# Patient Record
Sex: Male | Born: 1945 | Race: White | Hispanic: No | Marital: Married | Smoking: Current every day smoker
Health system: Southern US, Community
[De-identification: ages and names within clinical notes are randomized; demographics above are authoritative.]

## PROBLEM LIST (undated history)

## (undated) DIAGNOSIS — I10 Essential (primary) hypertension: Secondary | ICD-10-CM

## (undated) DIAGNOSIS — K219 Gastro-esophageal reflux disease without esophagitis: Secondary | ICD-10-CM

## (undated) DIAGNOSIS — M1990 Unspecified osteoarthritis, unspecified site: Secondary | ICD-10-CM

---

## 1968-11-04 HISTORY — PX: OTHER SURGICAL HISTORY: SHX169

## 2010-03-04 DEATH — deceased

## 2011-07-22 ENCOUNTER — Emergency Department (HOSPITAL_COMMUNITY)
Admission: EM | Admit: 2011-07-22 | Discharge: 2011-07-23 | Disposition: A | Discharge status: Home / Self Care | Payer: BC Managed Care – PPO | Attending: Emergency Medicine | Admitting: Emergency Medicine

## 2011-07-22 DIAGNOSIS — W298XXA Contact with other powered powered hand tools and household machinery, initial encounter: Secondary | ICD-10-CM | POA: Insufficient documentation

## 2011-07-22 DIAGNOSIS — S68019A Complete traumatic metacarpophalangeal amputation of unspecified thumb, initial encounter: Secondary | ICD-10-CM | POA: Insufficient documentation

## 2011-07-29 NOTE — Op Note (Signed)
  NAME:  Travis Le, [name] NO.:  [id number]  MEDICAL RECORD NO.:  [id number]  LOCATION:                                 FACILITY:  PHYSICIAN:  Vanita Panda. Magnus Ivan, M.D.DATE OF BIRTH:  18-Aug-1946  DATE OF PROCEDURE:  07/22/2011 DATE OF DISCHARGE:                              OPERATIVE REPORT   PREPROCEDURE DIAGNOSIS:  Traumatic left thumb tip amputation.  POSTPROCEDURE DIAGNOSIS:  Traumatic left thumb tip amputation.  PROCEDURE:  Revision amputation of the left thumb tip through distal phalanx, left thumb.  SURGEON:  Vanita Panda. Magnus Ivan, MD  ANESTHESIA: 1. Plain lidocaine 1% digital block. 2. Plain Sensorcaine 0.25% digital block.  BLOOD LOSS:  Minimal.  COMPLICATIONS:  None.  INDICATIONS:  [name] is a 65 year old right-hand-dominant male who was working with table saw when the saw slipped and he cut the tip of his thumb off.  He was seen in [place] and then came for further evaluation and treatment to [facility].  In the ER, he was found to have exposed bone with complete loss of majority of his nail and a large portion of the palmar aspect of the thumb.  I recommended he undergo revision amputation in the emergency room under local block.  He understands this and does wish for me to perform this procedure.  PROCEDURE DESCRIPTION:  I was able to clean the hand completely with Betadine.  I then provided digital block with 1% plain lidocaine followed by 0.25% plain Sensorcaine.  Once adequate anesthesia was obtained, I used a rongeur to backup exposed bone, not all the way to the IP joint of the thumb.  I used electrocautery to cauterize the vessels with a neurovascular bundle.  I then fashioned the tissue as a flap from palmar to dorsal to close completely over the any exposed tissue.  I then cleaned the hand entirely and placed a sterile dressing on this.  He was discharged with appropriate followup instructions from the emergency  room.     Vanita Panda. Magnus Ivan, M.D.    CYB/MEDQ  D:  07/23/2011  T:  07/23/2011  Job:  [id number]  Electronically Signed by Doneen Poisson M.D. on 07/29/2011 07:12:54 PM

## 2014-11-11 ENCOUNTER — Other Ambulatory Visit: Payer: Self-pay | Admitting: Physical Medicine and Rehabilitation

## 2014-11-11 DIAGNOSIS — M545 Low back pain: Secondary | ICD-10-CM

## 2014-11-19 ENCOUNTER — Ambulatory Visit
Admission: RE | Admit: 2014-11-19 | Discharge: 2014-11-19 | Disposition: A | Discharge status: Home / Self Care | Payer: Medicare Other | Source: Ambulatory Visit | Attending: Physical Medicine and Rehabilitation | Admitting: Physical Medicine and Rehabilitation

## 2014-11-19 DIAGNOSIS — M545 Low back pain: Secondary | ICD-10-CM

## 2015-11-10 ENCOUNTER — Other Ambulatory Visit (HOSPITAL_COMMUNITY): Payer: Self-pay | Admitting: Orthopaedic Surgery

## 2015-11-16 NOTE — Pre-Procedure Instructions (Signed)
    Felicie MornDavid Larin  11/16/2015      [facility] [place] [address] [place] [address] Phone: [phone] Fax: [phone]    Your procedure is scheduled on Monday, January 16th, 2017.   [facility] at 5:30 A.M.   Call this number if you have problems the morning of surgery:  [phone]   Remember:  Do not eat food or drink liquids after midnight.   Take these medicines the morning of surgery with A SIP OF WATER: Amlodipine (Norvasc), Metoprolol Succinate (Toprol-XL), Omeprazole (Prilosec).  Stop taking: Aspirin NSAIDS, Aleve, Naproxen, Ibuprofen, Advil, Motrin, BC's, Goody's, Fish oil, all herbal medications, and all vitamins.    Do not wear jewelry.  Do not wear lotions, powders, or colognes.  You may NOT wear deodorant.  Men may shave face and neck.   Do not bring valuables to the hospital.  [facility] is not responsible for any belongings or valuables.  Contacts, dentures or bridgework may not be worn into surgery.  Leave your suitcase in the car.  After surgery it may be brought to your room.  For patients admitted to the hospital, discharge time will be determined by your treatment team.  Patients discharged the day of surgery will not be allowed to drive home.   Special instructions:  See attached.   Please read over the following fact sheets that you were given. Pain Booklet, Coughing and Deep Breathing, MRSA Information and Surgical Site Infection Prevention

## 2015-11-17 ENCOUNTER — Encounter (HOSPITAL_COMMUNITY)
Admission: RE | Admit: 2015-11-17 | Discharge: 2015-11-17 | Disposition: A | Discharge status: Home / Self Care | Payer: Medicare Other | Source: Ambulatory Visit | Attending: Specialist | Admitting: Specialist

## 2015-11-17 ENCOUNTER — Encounter (HOSPITAL_COMMUNITY): Payer: Self-pay

## 2015-11-17 ENCOUNTER — Encounter (HOSPITAL_COMMUNITY): Payer: Self-pay | Admitting: Emergency Medicine

## 2015-11-17 ENCOUNTER — Other Ambulatory Visit: Payer: Self-pay | Admitting: Specialist

## 2015-11-17 DIAGNOSIS — Z01812 Encounter for preprocedural laboratory examination: Secondary | ICD-10-CM | POA: Diagnosis not present

## 2015-11-17 DIAGNOSIS — Z79899 Other long term (current) drug therapy: Secondary | ICD-10-CM | POA: Insufficient documentation

## 2015-11-17 DIAGNOSIS — M4806 Spinal stenosis, lumbar region: Secondary | ICD-10-CM | POA: Diagnosis not present

## 2015-11-17 DIAGNOSIS — F17200 Nicotine dependence, unspecified, uncomplicated: Secondary | ICD-10-CM | POA: Insufficient documentation

## 2015-11-17 DIAGNOSIS — M545 Low back pain: Secondary | ICD-10-CM

## 2015-11-17 DIAGNOSIS — M5416 Radiculopathy, lumbar region: Secondary | ICD-10-CM | POA: Insufficient documentation

## 2015-11-17 DIAGNOSIS — K219 Gastro-esophageal reflux disease without esophagitis: Secondary | ICD-10-CM | POA: Diagnosis not present

## 2015-11-17 DIAGNOSIS — Z01818 Encounter for other preprocedural examination: Secondary | ICD-10-CM | POA: Diagnosis present

## 2015-11-17 DIAGNOSIS — I10 Essential (primary) hypertension: Secondary | ICD-10-CM | POA: Insufficient documentation

## 2015-11-17 HISTORY — DX: Essential (primary) hypertension: I10

## 2015-11-17 HISTORY — DX: Gastro-esophageal reflux disease without esophagitis: K21.9

## 2015-11-17 HISTORY — DX: Unspecified osteoarthritis, unspecified site: M19.90

## 2015-11-17 LAB — COMPREHENSIVE METABOLIC PANEL
ALT: 18 U/L (ref 17–63)
ANION GAP: 9 (ref 5–15)
AST: 17 U/L (ref 15–41)
Albumin: 3.3 g/dL — ABNORMAL LOW (ref 3.5–5.0)
Alkaline Phosphatase: 45 U/L (ref 38–126)
BUN: 9 mg/dL (ref 6–20)
CHLORIDE: 105 mmol/L (ref 101–111)
CO2: 24 mmol/L (ref 22–32)
CREATININE: 0.53 mg/dL — AB (ref 0.61–1.24)
Calcium: 9 mg/dL (ref 8.9–10.3)
Glucose, Bld: 124 mg/dL — ABNORMAL HIGH (ref 65–99)
Potassium: 3.4 mmol/L — ABNORMAL LOW (ref 3.5–5.1)
SODIUM: 138 mmol/L (ref 135–145)
Total Bilirubin: 0.6 mg/dL (ref 0.3–1.2)
Total Protein: 6.8 g/dL (ref 6.5–8.1)

## 2015-11-17 LAB — CBC
HCT: 30.9 % — ABNORMAL LOW (ref 39.0–52.0)
Hemoglobin: 8.8 g/dL — ABNORMAL LOW (ref 13.0–17.0)
MCH: 18.8 pg — AB (ref 26.0–34.0)
MCHC: 28.5 g/dL — AB (ref 30.0–36.0)
MCV: 66 fL — AB (ref 78.0–100.0)
PLATELETS: 278 10*3/uL (ref 150–400)
RBC: 4.68 MIL/uL (ref 4.22–5.81)
RDW: 20.4 % — AB (ref 11.5–15.5)
WBC: 9.5 10*3/uL (ref 4.0–10.5)

## 2015-11-17 LAB — SURGICAL PCR SCREEN
MRSA, PCR: NEGATIVE
Staphylococcus aureus: NEGATIVE

## 2015-11-17 MED ORDER — CHLORHEXIDINE GLUCONATE 4 % EX LIQD: 60.0000 mL | Freq: Once | CUTANEOUS | Status: DC

## 2015-11-17 NOTE — Progress Notes (Signed)
Anesthesia Chart Review:  Pt is 70 year old male scheduled for L3-4, L4-5, L5-S1 lumbar laminectomy, L3-4 microdiscectomy on 11/20/2015 with Dr. Otelia SergeantNitka.   PCP is Dr. Cheri [place]ousJohn [place].   PMH includes:  HTN, GERD. Current smoker. BMI 29.  Medications include: amlodipine, doxazosin, lasix, metoprolol, prilosec.   Preoperative labs reviewed.  H/H 8.8/30.9.   EKG 11/17/15: NSR. Moderate voltage criteria for LVH, may be normal variant  Pt does not have history of anemia. Reviewed case with Dr. Katrinka BlazingSmith. Notified Sherrie in Dr. Barbaraann FasterNitka's office of low hgb. Defer to Dr. Otelia SergeantNitka on whether to proceed.   Rica Mastngela Arth Nicastro, FNP-BC [facility] Phone: [phone] 11/17/2015 2:13 PM

## 2015-11-17 NOTE — Progress Notes (Signed)
Called PCP to see if they have any previous labs for comparison,office closed until 1330.  Will try when office opens.

## 2015-11-17 NOTE — Progress Notes (Signed)
[facility] Barbaraann FasterNitka's office regarding Mr. Katrinka BlazingSmith labs.  HGB 8.8. Has a medical clearance in the chart but no mention of labs being done or previous problems of low blood counts.

## 2015-11-19 ENCOUNTER — Other Ambulatory Visit: Payer: Medicare Other

## 2015-11-20 ENCOUNTER — Ambulatory Visit (HOSPITAL_COMMUNITY): Admission: RE | Admit: 2015-11-20 | Payer: Medicare Other | Source: Ambulatory Visit | Admitting: Specialist

## 2015-11-20 ENCOUNTER — Encounter (HOSPITAL_COMMUNITY): Admission: RE | Payer: Self-pay | Source: Ambulatory Visit

## 2015-11-20 SURGERY — MICRODISCECTOMY LUMBAR LAMINECTOMY
Anesthesia: General

## 2015-12-20 ENCOUNTER — Ambulatory Visit
Admission: RE | Admit: 2015-12-20 | Discharge: 2015-12-20 | Disposition: A | Discharge status: Home / Self Care | Payer: Medicare Other | Source: Ambulatory Visit | Attending: Specialist | Admitting: Specialist

## 2015-12-20 DIAGNOSIS — M545 Low back pain: Secondary | ICD-10-CM

## 2015-12-21 ENCOUNTER — Encounter (HOSPITAL_COMMUNITY)
Admission: RE | Admit: 2015-12-21 | Discharge: 2015-12-21 | Disposition: A | Discharge status: Home / Self Care | Payer: Medicare Other | Source: Ambulatory Visit | Attending: Specialist | Admitting: Specialist

## 2015-12-21 ENCOUNTER — Other Ambulatory Visit: Payer: Self-pay | Admitting: Specialist

## 2015-12-21 ENCOUNTER — Encounter (HOSPITAL_COMMUNITY): Payer: Self-pay

## 2015-12-21 DIAGNOSIS — M4806 Spinal stenosis, lumbar region: Secondary | ICD-10-CM | POA: Insufficient documentation

## 2015-12-21 DIAGNOSIS — Z01812 Encounter for preprocedural laboratory examination: Secondary | ICD-10-CM | POA: Diagnosis not present

## 2015-12-21 DIAGNOSIS — M5416 Radiculopathy, lumbar region: Secondary | ICD-10-CM | POA: Diagnosis not present

## 2015-12-21 LAB — CBC
HCT: 38.1 % — ABNORMAL LOW (ref 39.0–52.0)
HEMOGLOBIN: 11.5 g/dL — AB (ref 13.0–17.0)
MCH: 22.3 pg — AB (ref 26.0–34.0)
MCHC: 30.2 g/dL (ref 30.0–36.0)
MCV: 74 fL — AB (ref 78.0–100.0)
Platelets: 261 10*3/uL (ref 150–400)
RBC: 5.15 MIL/uL (ref 4.22–5.81)
RDW: 28.2 % — ABNORMAL HIGH (ref 11.5–15.5)
WBC: 8.4 10*3/uL (ref 4.0–10.5)

## 2015-12-21 LAB — SURGICAL PCR SCREEN
MRSA, PCR: NEGATIVE
Staphylococcus aureus: NEGATIVE

## 2015-12-21 LAB — COMPREHENSIVE METABOLIC PANEL
ALK PHOS: 60 U/L (ref 38–126)
ALT: 15 U/L — AB (ref 17–63)
AST: 14 U/L — AB (ref 15–41)
Albumin: 3.3 g/dL — ABNORMAL LOW (ref 3.5–5.0)
Anion gap: 10 (ref 5–15)
BUN: 13 mg/dL (ref 6–20)
CALCIUM: 8.8 mg/dL — AB (ref 8.9–10.3)
CHLORIDE: 104 mmol/L (ref 101–111)
CO2: 22 mmol/L (ref 22–32)
CREATININE: 0.59 mg/dL — AB (ref 0.61–1.24)
Glucose, Bld: 114 mg/dL — ABNORMAL HIGH (ref 65–99)
Potassium: 3.2 mmol/L — ABNORMAL LOW (ref 3.5–5.1)
Sodium: 136 mmol/L (ref 135–145)
Total Bilirubin: 0.4 mg/dL (ref 0.3–1.2)
Total Protein: 6.8 g/dL (ref 6.5–8.1)

## 2015-12-21 NOTE — Progress Notes (Signed)
Since the last visit to [facility]- pt. Has seen PCP & was taken off pain med., also reports that he was started on oral  iron which he says he has been compliant to.  Pt. Reports also that he had a colonoscopy & one polyp removed, no bleeding seen.

## 2015-12-21 NOTE — Pre-Procedure Instructions (Signed)
Travis Le  12/21/2015      [facility] - [place] - [address] [place] [address] Phone: [phone] Fax: [phone]    Your procedure is scheduled on 12/25/2015  Report to [facility] Admitting at 5:30 A.M.  Call this number if you have problems the morning of surgery:  [phone]   Remember:  Do not eat food or drink liquids after midnight.  On .  Place clean sheets on your bed the night of your  first shower and do not sleep with pets.  Day of Surgery  Do not apply any lotions/deodorants the morning of surgery.  Please wear clean clothes to the hospital/surgery center.  Please read over the following fact sheets that you were given. Pain Booklet, Coughing and Deep Breathing, MRSA Information and Surgical Site Infection Prevention

## 2015-12-22 MED ORDER — VANCOMYCIN HCL IN DEXTROSE 1-5 GM/200ML-% IV SOLN
1000.0000 mg | INTRAVENOUS | Status: AC
  Administered 2015-12-25: 1000 mg via INTRAVENOUS
  Filled 2015-12-22: qty 200

## 2015-12-22 MED ORDER — CHLORHEXIDINE GLUCONATE 4 % EX LIQD: 60.0000 mL | Freq: Once | CUTANEOUS | Status: DC

## 2015-12-24 NOTE — Anesthesia Preprocedure Evaluation (Signed)
Anesthesia Evaluation  Patient identified by MRN, date of birth, ID band Patient awake    Reviewed: Allergy & Precautions, NPO status , Patient's Chart, lab work & pertinent test results, reviewed documented beta blocker date and time   Airway Mallampati: II  TM Distance: >3 FB Neck ROM: Full    Dental no notable dental hx.    Pulmonary Current Smoker,    Pulmonary exam normal breath sounds clear to auscultation       Cardiovascular hypertension, Pt. on medications and Pt. on home beta blockers  Rhythm:Regular Rate:Normal     Neuro/Psych negative neurological ROS  negative psych ROS   GI/Hepatic Neg liver ROS, GERD  ,  Endo/Other  negative endocrine ROS  Renal/GU negative Renal ROS     Musculoskeletal  (+) Arthritis ,   Abdominal   Peds  Hematology negative hematology ROS (+)   Anesthesia Other Findings   Reproductive/Obstetrics negative OB ROS                             Anesthesia Physical Anesthesia Plan  ASA: II  Anesthesia Plan: General   Post-op Pain Management:    Induction: Intravenous  Airway Management Planned: Oral ETT  Additional Equipment:   Intra-op Plan:   Post-operative Plan: Extubation in OR  Informed Consent: I have reviewed the patients History and Physical, chart, labs and discussed the procedure including the risks, benefits and alternatives for the proposed anesthesia with the patient or authorized representative who has indicated his/her understanding and acceptance.   Dental advisory given  Plan Discussed with: CRNA  Anesthesia Plan Comments:         Anesthesia Quick Evaluation

## 2015-12-25 ENCOUNTER — Ambulatory Visit (HOSPITAL_COMMUNITY): Payer: Medicare Other | Admitting: Anesthesiology

## 2015-12-25 ENCOUNTER — Encounter (HOSPITAL_COMMUNITY)
Admission: RE | Disposition: A | Discharge status: Home / Self Care | Payer: Self-pay | Source: Ambulatory Visit | Attending: Specialist

## 2015-12-25 ENCOUNTER — Ambulatory Visit (HOSPITAL_COMMUNITY): Payer: Medicare Other

## 2015-12-25 ENCOUNTER — Ambulatory Visit (HOSPITAL_COMMUNITY)
Admission: RE | Admit: 2015-12-25 | Discharge: 2015-12-26 | Disposition: A | Discharge status: Home / Self Care | Payer: Medicare Other | Source: Ambulatory Visit | Attending: Specialist | Admitting: Specialist

## 2015-12-25 DIAGNOSIS — Z79899 Other long term (current) drug therapy: Secondary | ICD-10-CM | POA: Diagnosis not present

## 2015-12-25 DIAGNOSIS — M4806 Spinal stenosis, lumbar region: Secondary | ICD-10-CM | POA: Insufficient documentation

## 2015-12-25 DIAGNOSIS — Z419 Encounter for procedure for purposes other than remedying health state, unspecified: Secondary | ICD-10-CM

## 2015-12-25 DIAGNOSIS — F17210 Nicotine dependence, cigarettes, uncomplicated: Secondary | ICD-10-CM | POA: Diagnosis not present

## 2015-12-25 DIAGNOSIS — M1990 Unspecified osteoarthritis, unspecified site: Secondary | ICD-10-CM | POA: Insufficient documentation

## 2015-12-25 DIAGNOSIS — M5116 Intervertebral disc disorders with radiculopathy, lumbar region: Secondary | ICD-10-CM | POA: Insufficient documentation

## 2015-12-25 DIAGNOSIS — K219 Gastro-esophageal reflux disease without esophagitis: Secondary | ICD-10-CM | POA: Insufficient documentation

## 2015-12-25 DIAGNOSIS — I10 Essential (primary) hypertension: Secondary | ICD-10-CM | POA: Diagnosis not present

## 2015-12-25 DIAGNOSIS — D509 Iron deficiency anemia, unspecified: Secondary | ICD-10-CM | POA: Diagnosis not present

## 2015-12-25 DIAGNOSIS — M48062 Spinal stenosis, lumbar region with neurogenic claudication: Secondary | ICD-10-CM | POA: Diagnosis present

## 2015-12-25 HISTORY — PX: LUMBAR LAMINECTOMY/DECOMPRESSION MICRODISCECTOMY: SHX5026

## 2015-12-25 SURGERY — LUMBAR LAMINECTOMY/DECOMPRESSION MICRODISCECTOMY
Anesthesia: General

## 2015-12-25 MED ORDER — NEOSTIGMINE METHYLSULFATE 10 MG/10ML IV SOLN
INTRAVENOUS | Status: AC
  Filled 2015-12-25: qty 1

## 2015-12-25 MED ORDER — HYDROMORPHONE HCL 1 MG/ML IJ SOLN
INTRAMUSCULAR | Status: AC
  Administered 2015-12-25: 0.5 mg via INTRAVENOUS
  Filled 2015-12-25: qty 1

## 2015-12-25 MED ORDER — ZOLPIDEM TARTRATE 5 MG PO TABS
5.0000 mg | ORAL_TABLET | Freq: Every evening | ORAL | Status: DC | PRN
  Administered 2015-12-25: 5 mg via ORAL
  Filled 2015-12-25: qty 1

## 2015-12-25 MED ORDER — HYDROMORPHONE HCL 1 MG/ML IJ SOLN
0.2500 mg | INTRAMUSCULAR | Status: DC | PRN
  Administered 2015-12-25 (×2): 0.5 mg via INTRAVENOUS

## 2015-12-25 MED ORDER — VANCOMYCIN HCL IN DEXTROSE 1-5 GM/200ML-% IV SOLN
1000.0000 mg | Freq: Two times a day (BID) | INTRAVENOUS | Status: AC
  Administered 2015-12-25: 1000 mg via INTRAVENOUS
  Filled 2015-12-25: qty 200

## 2015-12-25 MED ORDER — THROMBIN 20000 UNITS EX SOLR
CUTANEOUS | Status: AC
  Filled 2015-12-25: qty 20000

## 2015-12-25 MED ORDER — PANTOPRAZOLE SODIUM 40 MG PO TBEC
40.0000 mg | DELAYED_RELEASE_TABLET | Freq: Every day | ORAL | Status: DC
  Administered 2015-12-25 – 2015-12-26 (×2): 40 mg via ORAL
  Filled 2015-12-25 (×2): qty 1

## 2015-12-25 MED ORDER — MENTHOL 3 MG MT LOZG: 1.0000 | LOZENGE | OROMUCOSAL | Status: DC | PRN

## 2015-12-25 MED ORDER — ACETAMINOPHEN 10 MG/ML IV SOLN
INTRAVENOUS | Status: AC
  Administered 2015-12-25: 1000 mg via INTRAVENOUS
  Filled 2015-12-25: qty 100

## 2015-12-25 MED ORDER — SODIUM CHLORIDE 0.9 % IV SOLN: 250.0000 mL | INTRAVENOUS | Status: DC

## 2015-12-25 MED ORDER — DOCUSATE SODIUM 100 MG PO CAPS
100.0000 mg | ORAL_CAPSULE | Freq: Two times a day (BID) | ORAL | Status: DC
  Administered 2015-12-25 – 2015-12-26 (×2): 100 mg via ORAL
  Filled 2015-12-25 (×2): qty 1

## 2015-12-25 MED ORDER — HYDROCODONE-ACETAMINOPHEN 5-325 MG PO TABS: 1.0000 | ORAL_TABLET | ORAL | Status: DC | PRN

## 2015-12-25 MED ORDER — GLYCOPYRROLATE 0.2 MG/ML IJ SOLN
INTRAMUSCULAR | Status: DC | PRN
  Administered 2015-12-25: .6 mg via INTRAVENOUS

## 2015-12-25 MED ORDER — LACTATED RINGERS IV SOLN
INTRAVENOUS | Status: DC | PRN
  Administered 2015-12-25 (×2): via INTRAVENOUS

## 2015-12-25 MED ORDER — BISACODYL 5 MG PO TBEC: 5.0000 mg | DELAYED_RELEASE_TABLET | Freq: Every day | ORAL | Status: DC | PRN

## 2015-12-25 MED ORDER — OXYCODONE HCL 5 MG/5ML PO SOLN: 5.0000 mg | Freq: Once | ORAL | Status: AC | PRN

## 2015-12-25 MED ORDER — LIDOCAINE HCL (CARDIAC) 20 MG/ML IV SOLN
INTRAVENOUS | Status: AC
  Filled 2015-12-25: qty 5

## 2015-12-25 MED ORDER — FENTANYL CITRATE (PF) 250 MCG/5ML IJ SOLN
INTRAMUSCULAR | Status: AC
  Filled 2015-12-25: qty 5

## 2015-12-25 MED ORDER — SODIUM CHLORIDE 0.9% FLUSH: 3.0000 mL | INTRAVENOUS | Status: DC | PRN

## 2015-12-25 MED ORDER — VANCOMYCIN HCL IN DEXTROSE 1-5 GM/200ML-% IV SOLN: 1000.0000 mg | Freq: Two times a day (BID) | INTRAVENOUS | Status: DC

## 2015-12-25 MED ORDER — PHENOL 1.4 % MT LIQD: 1.0000 | OROMUCOSAL | Status: DC | PRN

## 2015-12-25 MED ORDER — OXYCODONE HCL 5 MG PO TABS
5.0000 mg | ORAL_TABLET | Freq: Once | ORAL | Status: AC | PRN
  Administered 2015-12-25: 5 mg via ORAL

## 2015-12-25 MED ORDER — KETOROLAC TROMETHAMINE 30 MG/ML IJ SOLN
INTRAMUSCULAR | Status: AC
  Filled 2015-12-25: qty 1

## 2015-12-25 MED ORDER — METHOCARBAMOL 500 MG PO TABS
ORAL_TABLET | ORAL | Status: AC
  Filled 2015-12-25: qty 1

## 2015-12-25 MED ORDER — METHOCARBAMOL 500 MG PO TABS
500.0000 mg | ORAL_TABLET | Freq: Four times a day (QID) | ORAL | Status: DC | PRN
  Administered 2015-12-25 – 2015-12-26 (×2): 500 mg via ORAL
  Filled 2015-12-25 (×2): qty 1

## 2015-12-25 MED ORDER — METOPROLOL SUCCINATE ER 100 MG PO TB24
100.0000 mg | ORAL_TABLET | Freq: Every day | ORAL | Status: DC
  Administered 2015-12-26: 100 mg via ORAL
  Filled 2015-12-25: qty 1

## 2015-12-25 MED ORDER — NEOSTIGMINE METHYLSULFATE 10 MG/10ML IV SOLN
INTRAVENOUS | Status: DC | PRN
  Administered 2015-12-25: 4 mg via INTRAVENOUS

## 2015-12-25 MED ORDER — VECURONIUM BROMIDE 10 MG IV SOLR
INTRAVENOUS | Status: AC
  Filled 2015-12-25: qty 10

## 2015-12-25 MED ORDER — ONDANSETRON HCL 4 MG/2ML IJ SOLN
INTRAMUSCULAR | Status: AC
  Filled 2015-12-25: qty 2

## 2015-12-25 MED ORDER — MIDAZOLAM HCL 2 MG/2ML IJ SOLN
INTRAMUSCULAR | Status: AC
  Filled 2015-12-25: qty 2

## 2015-12-25 MED ORDER — PROPOFOL 10 MG/ML IV BOLUS
INTRAVENOUS | Status: AC
  Filled 2015-12-25: qty 40

## 2015-12-25 MED ORDER — DEXAMETHASONE SODIUM PHOSPHATE 4 MG/ML IJ SOLN
INTRAMUSCULAR | Status: AC
  Filled 2015-12-25: qty 1

## 2015-12-25 MED ORDER — MORPHINE SULFATE (PF) 2 MG/ML IV SOLN
1.0000 mg | INTRAVENOUS | Status: DC | PRN
  Filled 2015-12-25: qty 1

## 2015-12-25 MED ORDER — PROMETHAZINE HCL 25 MG/ML IJ SOLN: 6.2500 mg | INTRAMUSCULAR | Status: DC | PRN

## 2015-12-25 MED ORDER — AMLODIPINE BESYLATE 10 MG PO TABS
10.0000 mg | ORAL_TABLET | Freq: Every day | ORAL | Status: DC
  Administered 2015-12-26: 10 mg via ORAL
  Filled 2015-12-25: qty 1

## 2015-12-25 MED ORDER — CEFAZOLIN SODIUM 1-5 GM-% IV SOLN: 1.0000 g | Freq: Three times a day (TID) | INTRAVENOUS | Status: DC

## 2015-12-25 MED ORDER — ONDANSETRON HCL 4 MG/2ML IJ SOLN
INTRAMUSCULAR | Status: DC | PRN
  Administered 2015-12-25: 4 mg via INTRAVENOUS

## 2015-12-25 MED ORDER — MIDAZOLAM HCL 5 MG/5ML IJ SOLN
INTRAMUSCULAR | Status: DC | PRN
  Administered 2015-12-25 (×2): 1 mg via INTRAVENOUS

## 2015-12-25 MED ORDER — FERROUS SULFATE 325 (65 FE) MG PO TABS
325.0000 mg | ORAL_TABLET | Freq: Every day | ORAL | Status: DC
  Administered 2015-12-26: 325 mg via ORAL
  Filled 2015-12-25: qty 1

## 2015-12-25 MED ORDER — PHENYLEPHRINE HCL 10 MG/ML IJ SOLN
10000.0000 ug | INTRAMUSCULAR | Status: DC | PRN
  Administered 2015-12-25: 20 ug/min via INTRAVENOUS

## 2015-12-25 MED ORDER — ACETAMINOPHEN 325 MG PO TABS: 650.0000 mg | ORAL_TABLET | ORAL | Status: DC | PRN

## 2015-12-25 MED ORDER — SODIUM CHLORIDE 0.9% FLUSH
3.0000 mL | Freq: Two times a day (BID) | INTRAVENOUS | Status: DC
  Administered 2015-12-25: 3 mL via INTRAVENOUS

## 2015-12-25 MED ORDER — ALUM & MAG HYDROXIDE-SIMETH 200-200-20 MG/5ML PO SUSP: 30.0000 mL | Freq: Four times a day (QID) | ORAL | Status: DC | PRN

## 2015-12-25 MED ORDER — GLYCOPYRROLATE 0.2 MG/ML IJ SOLN
INTRAMUSCULAR | Status: AC
  Filled 2015-12-25: qty 3

## 2015-12-25 MED ORDER — BUPIVACAINE HCL (PF) 0.5 % IJ SOLN
INTRAMUSCULAR | Status: AC
  Filled 2015-12-25: qty 30

## 2015-12-25 MED ORDER — POTASSIUM CHLORIDE IN NACL 20-0.9 MEQ/L-% IV SOLN
INTRAVENOUS | Status: DC
Start: 2015-12-25 — End: 2015-12-26
  Administered 2015-12-25: 15:00:00 via INTRAVENOUS
  Filled 2015-12-25 (×3): qty 1000

## 2015-12-25 MED ORDER — BUPIVACAINE LIPOSOME 1.3 % IJ SUSP
20.0000 mL | INTRAMUSCULAR | Status: AC
  Administered 2015-12-25: 20 mL
  Filled 2015-12-25: qty 20

## 2015-12-25 MED ORDER — ONDANSETRON HCL 4 MG/2ML IJ SOLN: 4.0000 mg | INTRAMUSCULAR | Status: DC | PRN

## 2015-12-25 MED ORDER — PROPOFOL 10 MG/ML IV BOLUS
INTRAVENOUS | Status: DC | PRN
  Administered 2015-12-25: 160 mg via INTRAVENOUS
  Administered 2015-12-25: 10 mg via INTRAVENOUS

## 2015-12-25 MED ORDER — GLYCOPYRROLATE 0.2 MG/ML IJ SOLN
INTRAMUSCULAR | Status: AC
  Filled 2015-12-25: qty 1

## 2015-12-25 MED ORDER — ARTIFICIAL TEARS OP OINT
TOPICAL_OINTMENT | OPHTHALMIC | Status: DC | PRN
  Administered 2015-12-25: 1 via OPHTHALMIC

## 2015-12-25 MED ORDER — NICOTINE 21 MG/24HR TD PT24
21.0000 mg | MEDICATED_PATCH | Freq: Every day | TRANSDERMAL | Status: DC
  Administered 2015-12-25: 21 mg via TRANSDERMAL
  Filled 2015-12-25 (×2): qty 1

## 2015-12-25 MED ORDER — BUPIVACAINE HCL 0.5 % IJ SOLN
INTRAMUSCULAR | Status: DC | PRN
Start: 2015-12-25 — End: 2015-12-25
  Administered 2015-12-25: 20 mL

## 2015-12-25 MED ORDER — OXYCODONE-ACETAMINOPHEN 5-325 MG PO TABS
1.0000 | ORAL_TABLET | ORAL | Status: DC | PRN
  Administered 2015-12-25 – 2015-12-26 (×4): 2 via ORAL
  Filled 2015-12-25 (×5): qty 2

## 2015-12-25 MED ORDER — METHOCARBAMOL 1000 MG/10ML IJ SOLN: 500.0000 mg | Freq: Four times a day (QID) | INTRAVENOUS | Status: DC | PRN

## 2015-12-25 MED ORDER — DEXAMETHASONE SODIUM PHOSPHATE 10 MG/ML IJ SOLN
INTRAMUSCULAR | Status: DC | PRN
  Administered 2015-12-25: 4 mg via INTRAVENOUS

## 2015-12-25 MED ORDER — THROMBIN 20000 UNITS EX KIT
PACK | CUTANEOUS | Status: DC | PRN
  Administered 2015-12-25: 20000 [IU] via TOPICAL

## 2015-12-25 MED ORDER — MEPERIDINE HCL 25 MG/ML IJ SOLN: 6.2500 mg | INTRAMUSCULAR | Status: DC | PRN

## 2015-12-25 MED ORDER — OXYCODONE HCL 5 MG PO TABS
ORAL_TABLET | ORAL | Status: AC
  Filled 2015-12-25: qty 1

## 2015-12-25 MED ORDER — LIDOCAINE HCL (CARDIAC) 20 MG/ML IV SOLN
INTRAVENOUS | Status: DC | PRN
  Administered 2015-12-25: 100 mg via INTRAVENOUS

## 2015-12-25 MED ORDER — KETOROLAC TROMETHAMINE 30 MG/ML IJ SOLN
30.0000 mg | Freq: Once | INTRAMUSCULAR | Status: DC
  Administered 2015-12-25: 30 mg via INTRAVENOUS

## 2015-12-25 MED ORDER — VECURONIUM BROMIDE 10 MG IV SOLR
INTRAVENOUS | Status: DC | PRN
  Administered 2015-12-25: 2 mg via INTRAVENOUS
  Administered 2015-12-25: 8 mg via INTRAVENOUS
  Administered 2015-12-25: 2 mg via INTRAVENOUS
  Administered 2015-12-25: 1 mg via INTRAVENOUS

## 2015-12-25 MED ORDER — ACETAMINOPHEN 650 MG RE SUPP: 650.0000 mg | RECTAL | Status: DC | PRN

## 2015-12-25 MED ORDER — DOXAZOSIN MESYLATE 2 MG PO TABS
2.0000 mg | ORAL_TABLET | Freq: Every day | ORAL | Status: DC
  Administered 2015-12-26: 2 mg via ORAL
  Filled 2015-12-25: qty 1

## 2015-12-25 MED ORDER — POLYETHYLENE GLYCOL 3350 17 G PO PACK: 17.0000 g | PACK | Freq: Every day | ORAL | Status: DC | PRN

## 2015-12-25 MED ORDER — ARTIFICIAL TEARS OP OINT
TOPICAL_OINTMENT | OPHTHALMIC | Status: AC
  Filled 2015-12-25: qty 3.5

## 2015-12-25 MED ORDER — FUROSEMIDE 20 MG PO TABS
20.0000 mg | ORAL_TABLET | Freq: Every day | ORAL | Status: DC
  Administered 2015-12-26: 20 mg via ORAL
  Filled 2015-12-25: qty 1

## 2015-12-25 MED ORDER — FENTANYL CITRATE (PF) 100 MCG/2ML IJ SOLN
INTRAMUSCULAR | Status: DC | PRN
  Administered 2015-12-25: 50 ug via INTRAVENOUS
  Administered 2015-12-25: 150 ug via INTRAVENOUS
  Administered 2015-12-25 (×3): 50 ug via INTRAVENOUS

## 2015-12-25 SURGICAL SUPPLY — 47 items
BUR RND FLUTED 2.5 (BURR) IMPLANT
BUR SABER RD CUTTING 3.0 (BURR) IMPLANT
CANISTER SUCTION 2500CC (MISCELLANEOUS) ×2 IMPLANT
COVER SURGICAL LIGHT HANDLE (MISCELLANEOUS) ×2 IMPLANT
DERMABOND ADVANCED (GAUZE/BANDAGES/DRESSINGS) ×1
DERMABOND ADVANCED .7 DNX12 (GAUZE/BANDAGES/DRESSINGS) ×1 IMPLANT
DRAPE INCISE IOBAN 66X45 STRL (DRAPES) ×2 IMPLANT
DRAPE MICROSCOPE LEICA (MISCELLANEOUS) ×2 IMPLANT
DRAPE PROXIMA HALF (DRAPES) IMPLANT
DRAPE SURG 17X23 STRL (DRAPES) ×8 IMPLANT
DRSG MEPILEX BORDER 4X4 (GAUZE/BANDAGES/DRESSINGS) IMPLANT
DRSG MEPILEX BORDER 4X8 (GAUZE/BANDAGES/DRESSINGS) ×2 IMPLANT
DURAPREP 26ML APPLICATOR (WOUND CARE) ×2 IMPLANT
ELECT REM PT RETURN 9FT ADLT (ELECTROSURGICAL) ×2
ELECTRODE REM PT RTRN 9FT ADLT (ELECTROSURGICAL) ×1 IMPLANT
EVACUATOR 1/8 PVC DRAIN (DRAIN) IMPLANT
GLOVE BIOGEL PI IND STRL 8 (GLOVE) ×1 IMPLANT
GLOVE BIOGEL PI INDICATOR 8 (GLOVE) ×1
GLOVE ECLIPSE 9.0 STRL (GLOVE) ×2 IMPLANT
GLOVE ORTHO TXT STRL SZ7.5 (GLOVE) ×2 IMPLANT
GLOVE SURG 8.5 LATEX PF (GLOVE) ×2 IMPLANT
GOWN STRL REUS W/ TWL LRG LVL3 (GOWN DISPOSABLE) ×1 IMPLANT
GOWN STRL REUS W/TWL 2XL LVL3 (GOWN DISPOSABLE) ×4 IMPLANT
GOWN STRL REUS W/TWL LRG LVL3 (GOWN DISPOSABLE) ×1
KIT BASIN OR (CUSTOM PROCEDURE TRAY) ×2 IMPLANT
KIT ROOM TURNOVER OR (KITS) ×2 IMPLANT
NEEDLE SPNL 18GX3.5 QUINCKE PK (NEEDLE) ×4 IMPLANT
NS IRRIG 1000ML POUR BTL (IV SOLUTION) ×2 IMPLANT
PACK LAMINECTOMY ORTHO (CUSTOM PROCEDURE TRAY) ×2 IMPLANT
PAD ARMBOARD 7.5X6 YLW CONV (MISCELLANEOUS) ×4 IMPLANT
PATTIES SURGICAL .5 X.5 (GAUZE/BANDAGES/DRESSINGS) IMPLANT
PATTIES SURGICAL .75X.75 (GAUZE/BANDAGES/DRESSINGS) ×2 IMPLANT
PATTIES SURGICAL 1X1 (DISPOSABLE) IMPLANT
SPONGE LAP 4X18 X RAY DECT (DISPOSABLE) IMPLANT
SPONGE SURGIFOAM ABS GEL 100 (HEMOSTASIS) ×2 IMPLANT
SUT VIC AB 0 CT1 27 (SUTURE) ×1
SUT VIC AB 0 CT1 27XBRD ANBCTR (SUTURE) ×1 IMPLANT
SUT VIC AB 1 CT1 27 (SUTURE) ×1
SUT VIC AB 1 CT1 27XBRD ANBCTR (SUTURE) ×1 IMPLANT
SUT VIC AB 2-0 CT1 27 (SUTURE) ×2
SUT VIC AB 2-0 CT1 TAPERPNT 27 (SUTURE) ×2 IMPLANT
SUT VIC AB 3-0 X1 27 (SUTURE) ×2 IMPLANT
SUT VICRYL 0 UR6 27IN ABS (SUTURE) ×2 IMPLANT
TOWEL OR 17X24 6PK STRL BLUE (TOWEL DISPOSABLE) ×2 IMPLANT
TOWEL OR 17X26 10 PK STRL BLUE (TOWEL DISPOSABLE) ×2 IMPLANT
TRAY FOLEY CATH 16FRSI W/METER (SET/KITS/TRAYS/PACK) ×2 IMPLANT
WATER STERILE IRR 1000ML POUR (IV SOLUTION) ×2 IMPLANT

## 2015-12-25 NOTE — Interval H&P Note (Signed)
Patient was seen and examined in the preop holding area. There has been no interval  Change in this patient's exam preop  history and physical exam  Lab tests and images have been examined and reviewed.  The Risks benefits and alternative treatments have been discussed  extensively,questions answered.  The patient has elected to undergo the discussed surgical treatment. 

## 2015-12-25 NOTE — Progress Notes (Signed)
Foley catheter discontinued, due to void by 11pm.  Pt ambulated from bed to chair without difficulty.  Will continue to monitor. Sondra Come, RN

## 2015-12-25 NOTE — Discharge Instructions (Addendum)
No lifting greater than 10 lbs. Avoid bending, stooping and twisting. Walk in house for first week them may start to get out slowly increasing distance up to one quarter mile by 3 weeks post op. Keep incision dry for 3 days, may use tegaderm or similar water impervious dressing.  Constipation, Adult Constipation is when a person has fewer than three bowel movements a week, has difficulty having a bowel movement, or has stools that are dry, hard, or larger than normal. As people grow older, constipation is more common. A low-fiber diet, not taking in enough fluids, and taking certain medicines may make constipation worse.  CAUSES   Certain medicines, such as antidepressants, pain medicine, iron supplements, antacids, and water pills.   Certain diseases, such as diabetes, irritable bowel syndrome (IBS), thyroid disease, or depression.   Not drinking enough water.   Not eating enough fiber-rich foods.   Stress or travel.   Lack of physical activity or exercise.   Ignoring the urge to have a bowel movement.   Using laxatives too much.  SIGNS AND SYMPTOMS   Having fewer than three bowel movements a week.   Straining to have a bowel movement.   Having stools that are hard, dry, or larger than normal.   Feeling full or bloated.   Pain in the lower abdomen.   Not feeling relief after having a bowel movement.  DIAGNOSIS  Your health care provider will take a medical history and perform a physical exam. Further testing may be done for severe constipation. Some tests may include:  A barium enema X-ray to examine your rectum, colon, and, sometimes, your small intestine.   A sigmoidoscopy to examine your lower colon.   A colonoscopy to examine your entire colon. TREATMENT  Treatment will depend on the severity of your constipation and what is causing it. Some dietary treatments include drinking more fluids and eating more fiber-rich foods. Lifestyle treatments  may include regular exercise. If these diet and lifestyle recommendations do not help, your health care provider may recommend taking over-the-counter laxative medicines to help you have bowel movements. Prescription medicines may be prescribed if over-the-counter medicines do not work.  HOME CARE INSTRUCTIONS   Eat foods that have a lot of fiber, such as fruits, vegetables, whole grains, and beans.  Limit foods high in fat and processed sugars, such as french fries, hamburgers, cookies, candies, and soda.   A fiber supplement may be added to your diet if you cannot get enough fiber from foods.   Drink enough fluids to keep your urine clear or pale yellow.   Exercise regularly or as directed by your health care provider.   Go to the restroom when you have the urge to go. Do not hold it.   Only take over-the-counter or prescription medicines as directed by your health care provider. Do not take other medicines for constipation without talking to your health care provider first.  SEEK IMMEDIATE MEDICAL CARE IF:   You have bright red blood in your stool.   Your constipation lasts for more than 4 days or gets worse.   You have abdominal or rectal pain.   You have thin, pencil-like stools.   You have unexplained weight loss. MAKE SURE YOU:   Understand these instructions.  Will watch your condition.  Will get help right away if you are not doing well or get worse.   This information is not intended to replace advice given to you by your health care  provider. Make sure you discuss any questions you have with your health care provider.   Document Released: 07/19/2004 Document Revised: 11/11/2014 Document Reviewed: 08/02/2013 Elsevier Interactive Patient Education [employer]

## 2015-12-25 NOTE — Transfer of Care (Signed)
Immediate Anesthesia Transfer of Care Note  Patient: Travis Le  Procedure(s) Performed: Procedure(s): CENTRAL LUMBAR LAMINECTOMY L3-4, L4-5 AND L5-S1, MICRODISCECTOMY RIGHT L3-4 (N/A)  Patient Location: PACU  Anesthesia Type:General  Level of Consciousness: awake, sedated and patient cooperative  Airway & Oxygen Therapy: Patient Spontanous Breathing and Patient connected to nasal cannula oxygen  Post-op Assessment: Report given to RN, Post -op Vital signs reviewed and stable, Patient moving all extremities and Patient moving all extremities X 4  Post vital signs: Reviewed and stable  Last Vitals:  Filed Vitals:   12/25/15 0617  BP: 142/83  Pulse: 70  Temp: 36.3 C  Resp: 16    Complications: No apparent anesthesia complications

## 2015-12-25 NOTE — Op Note (Signed)
12/25/2015  11:17 AM  PATIENT:  Travis Le  70 y.o. male  MRN: [id number]  OPERATIVE REPORT  PRE-OPERATIVE DIAGNOSIS:  lumbar spinal stenosis L3-4, L4-5 and L5-S1  POST-OPERATIVE DIAGNOSIS:  lumbar spinal stenosis L3-4, L4-5 and L5-S1  PROCEDURE: Procedure(s): CENTRAL DECOMPRESSIVE LUMBAR LAMINECTOMIES L3-4, L4-5 AND BILATERAL HEMILAMINECTOMY L5-S1 Right L3-4 microdiscectomy.   SURGEON: Jessy Oto, MD    ASSISTANT: Benjiman Core, PA-C (Present throughout the entire procedure and necessary for completion of procedure in a timely manner)    ANESTHESIA: General,supplemented with local marcaine 1/2% plain:exparel 1.3% 1:1. Total of 20 cc were used. Dr. Linna Caprice.   EBL:200CC  DRAINS: Foley to SD.  COMPLICATIONS: None  PROCEDURE: The patient was met in the holding area, and the appropriate L3-4 to L5-S1 levels identified and not marked with an "X" and my initials since this was a bilateral procedure. The patient was then transported to OR and was placed under general anesthesia without difficulty. Then placed on the operative table in a prone position. [facility] spine OR table was used. The patient received appropriate preoperative antibiotic prophylaxis. Nursing staff inserted a Foley catheter under sterile conditions prior to turning. Standard prep with DuraPrep solution from the mid dorsal spine to the mid sacral level.Time-out procedure was called and correct .  Patient was draped in the usual manner. Iodine Vi-Drape was used.The skin was infiltrated with marcaine 1/2% plain:exparel 1.3% 1:1. Total of 30 cc were used. Skin subcutaneous layers divide down to the lumbodorsal fascia this was incised in both L3 and superior L4 spinous processes, Initial incision were made at the expected L3 and L4 levels approximately 3 1/2inches in length.Kocher clamp placed on the spinous process of L4. Intraoperative lateral cross table lateral radiograph demonstrated the clamp  placed posterior at the L4 spinous process level. The L3-4 to L5-S1 levels were then exposed using Cobb elevators and electrocautery. These areas were then packed. Boss McCoullough retractors were inserted at the incision site. Leksell rongeur used to remove the inferior 20% of spinous process of L3 and the entire spinous processes of L4. Leksell rongeur was then used to further remove bone down to the ligamentum flavum and the central portions of the lamina and base of the spinous process L4 was thinned. 4 mm burr was also used to thin the lamina of L4 centrally. The facets were then exposed out laterally and were hypertrophic.Osteotomes then used medial aspect of the L3-4 facet and L4-5 facet bilaterally resecting approximately 15-20% of the facet bilaterally. The operating room microscope sterilely draped brought into the field. Kerrisons were then used to resect central portions of the entire lamina of L4. Ligamentum flavum at the L3-4 and L4-5 levels were then carefully resected centrally preserving at least 7-8 mm with at the pars level L4 and L3. The central laminectomy was further widened performing more resection of the medial aspect of facet at both L3-4 and L4-5. The area of the central laminectomy superiorly was hemostased With bone wax to the bleeding bone surfaces and thrombin soak gel foam and cottonoids. The right side of the spinal canal decompressed at each level beginning at the right L5 neuroforamen resect bone over the superior lamina of L5 and decompressing the right L5 foramen and reflected portion ligamentum flavum off the medial aspect of the right L4-5 facet. Hockey-stick nerve probe could be passed out the L4 and L5 neuroforamen. The medial aspect of facet at the L3-5 level was then carefully evaluated and hypertrophic ligamentum  flavum resected using Kerrisons decompressing the lateral recess on this right side. This was done such that hockey-stick nerve probe could be used to pass the  outer recess demonstrating the right L3 neuroforamen to be tight and the L3 nerve root entrapped due to underlying disc herniation and hypertrophic changes of the right L3-4 facet. Attention then turned to the right L3-4 were further debridement of the lateral recess and hypertrophic ligamentum flavum and reflected portions of ligamentum flavum were carried out. At the right L3-4 level severe lateral recess stenosis was noted lateral recess was decompressed and microdiscectomy carried out over the right posterolateral disc and foramenal area of the right L3-4 disc,the L4 nerve root freed up.The disc incised with a 15 blade scapel while retracting with a Derricho retractor.Following discectomy the right L3 nerve root was found to be well decompressed. A hockey neuroprobe could then be passed out the right L3 neuroforamen Further decompression was then carried out the right side of the spinal canal decompressing the L3, L4 and the L5 nerve roots. Following this then a hockey-stick nerve probe could be passed out easily bilateral L3 L4 and L5 Neuroforamen.  The bilateral L5-S1 levels were then exposed using Cobb elevators and electrocautery. These areas were then packed. Insight retractor was inserted at the caudal portion of incision site. Leksell rongeur used to remove a portion of the inferior aspect is process of L5 60% and the superior 30% of S1. Leksell rongeur was then used to further remove bone down to the ligamentum flavum and the central portions of the lamina and base of the spinous process were thinned. 4 mm burr was also used to thin the the inferior 50% of the lamina of L5 centrally. The facets were then exposed out laterally and were hypertrophic.Osteotomes then used medial aspect of the L5-S1 facets resecting approximately 10-15% of the facet bilaterally. Kerrisons were then used to resect central portions of the lamina of the inferior 50% of L5lamina. Ligamentum flavum at the L5-S1 levels were  then carefully resected centrally preserving at least 7-8 mm with at the pars level L5. The laminectomy was further widened performing more resection of the medial aspect of facet at both L5-S1. The central portions of the ligamentum flavum at the L5-S1 level were resected and the medial aspect of the L5-S1 facet resected over about 5-10%. Hypertrophic flava was found to be present. Following decompression a hockey stick neuroprobe could be passed out the bilateral S1 neuroforamen and the L5 neuroforamen. The right S1 nerve root showed the most significant Compression within the lateral recess of the right L5-S1.  Following decompression bilaterally thrombin-soaked Gelfoam was applied to the laminotomy defects and these areas packed with small sponges. Adequate hemostasis was obtained at all levels. The thrombin-soaked Gelfoam was then removed. DuraSeal was then applied over the right dura at the L3-4 level. The insight retractor was then removed. The end of the case, an approximately 15 mm central laminotomy was present at the L2 to L5 level and over the inferior 50% of the L5 laminal with normal pulsation of the thecal sac present. Following additional irrigation and with no active bleeding present at any level level, the incision was then closed by first approximating the lumbar muscles the midline with interrupted #1 Vicryl sutures loosely the lumbodorsal fascia then approximated with interrupted #1 Vicryl sutures. Deep subcutaneous layers approximated with interrupted #0 Vicryl suture more superficial layers with interrupted 2-0 Vicryl suture the skin closed with a running subcutaneous stitch of 4-0  Vicryl. Dermabond was applied to the skin margins. A mepilex bandage was applied to the midline incision site.. All instrument and sponge counts were correct. Patient was then reactivated returned to supine position and extubated. She was then returned to recovery room in satisfactory condition.   Benjiman Core,  PA-C performed the duties of assistant surgeon throughout this case he assisted using loupe magnification and OR microscope retracting delicate neural structures suctioning over the neural structures throughout the case bilaterally. He was present from the beginning of the case to the end of the case. Assisted in positioning the patient.He also participated in removal the patient from the operating table.    Arilla Hice E  12/25/2015, 11:17 AM

## 2015-12-25 NOTE — Brief Op Note (Signed)
PATIENT ID:      Travis Le  MRN:     [id number] DOB/AGE:    10-May-1946 / 70 y.o.       OPERATIVE REPORT   DATE OF PROCEDURE:  12/25/2015      PREOPERATIVE DIAGNOSIS:   lumbar spinal stenosis L3-4, L4-5 and L5-S1                                                       There is no weight on file to calculate BMI.    POSTOPERATIVE DIAGNOSIS:   lumbar spinal stenosis L3-4, L4-5 and L5-S1                                                                     There is no weight on file to calculate BMI.    PROCEDURE:  Procedure(s):CENTRAL LUMBAR LAMINECTOMY L3-4, L4-5 AND L5-S1, MICRODISCECTOMY RIGHT L3-4    SURGEON: Gearl Kimbrough Le   ASSISTANT: Andee Lineman         ANESTHESIA:  General and local infiltration with marcaine 0.5% 1:1 expaeral 1.3% total 30cc, Dr. Noreene Larsson.     DRAINS:Foley to SD.  COMPLICATIONS:  None   CONDITION:  Stable  EBL:200cc  DISPOSITION: Returned to the PACU in satisfactory condition.  FINDINGS: Severe Right L5-S1 lateral recess stenosis with right S1 nerve compression, mild left L5-S1 and bilateral L4-5 lateral recess stenosis, right L3-4 HNP with foraminal stenosis right L3 nerve compression.  Travis Le 12/25/2015, 11:10 AM

## 2015-12-25 NOTE — Anesthesia Procedure Notes (Signed)
Procedure Name: Intubation Date/Time: 12/25/2015 7:45 AM Performed by: Coralee Rud Pre-anesthesia Checklist: Patient identified, Emergency Drugs available, Suction available, Patient being monitored and Timeout performed Patient Re-evaluated:Patient Re-evaluated prior to inductionOxygen Delivery Method: Circle system utilized Preoxygenation: Pre-oxygenation with 100% oxygen Intubation Type: IV induction and Cricoid Pressure applied Ventilation: Oral airway inserted - appropriate to patient size and Mask ventilation without difficulty Laryngoscope Size: Glidescope and 3 Grade View: Grade II Tube type: Oral Tube size: 7.5 mm Number of attempts: 2 (first attempt DVL with MAC 4, unable to view cords; 2nd attempt with glidescope 3 MAC with grade 2 view.) Airway Equipment and Method: Stylet and Video-laryngoscopy Placement Confirmation: ETT inserted through vocal cords under direct vision,  positive ETCO2,  CO2 detector and breath sounds checked- equal and bilateral Secured at: 22 (incisor) cm Tube secured with: Tape (upper and lower tape) Dental Injury: Teeth and Oropharynx as per pre-operative assessment  Difficulty Due To: Difficulty was anticipated Future Recommendations: Recommend- induction with short-acting agent, and alternative techniques readily available Comments: glidescope in room

## 2015-12-25 NOTE — Progress Notes (Signed)
Pt arrived to 5C18.  In no apparent distress.  Alert and oriented, conversant with his family.  No complaints of nausea, drinking water without difficulty.  Will continue to monitor. Sondra Come, RN

## 2015-12-25 NOTE — Anesthesia Postprocedure Evaluation (Signed)
Anesthesia Post Note  Patient: Travis Le  Procedure(s) Performed: Procedure(s) (LRB): CENTRAL LUMBAR LAMINECTOMY L3-4, L4-5 AND L5-S1, MICRODISCECTOMY RIGHT L3-4 (N/A)  Patient location during evaluation: PACU Anesthesia Type: General Level of consciousness: sedated and patient cooperative Pain management: pain level controlled Vital Signs Assessment: post-procedure vital signs reviewed and stable Respiratory status: spontaneous breathing Cardiovascular status: stable Anesthetic complications: no    Last Vitals:  Filed Vitals:   12/25/15 1320 12/25/15 1335  BP: 122/76 125/80  Pulse: 66 65  Temp:    Resp: 9 13    Last Pain:  Filed Vitals:   12/25/15 1351  PainSc: 7                  Melodie Ashworth [employer]

## 2015-12-25 NOTE — H&P (Signed)
Travis Le is an 70 y.o. male.    CHIEF COMPLAINT:    Low back pain with radiation to the right lower extremity.   HISTORY OF PRESENT ILLNESS:   This patient is a 70 year old male who has been experiencing pain in his lower back for years and also has had intermittent radiation into his right lower extremity.  He has seen Travis Le, has undergone epidural steroid injections, multiple injections with epidurals performed in October 2015, a right transforaminal L5 ESI and also epidural steroid injections in January, as well as July 2016, again, right-sided transforaminal ESIs.  Further injections have been done then in September 2016, right L5-S1 and in November.  He was started on gabapentin and he has been sent for evaluation for consideration of intervention.  His pain is worse when he stands and ambulates and improves when he sits or bends.  He does tend to lean on carts when he is walking in the grocery store.  He notices pain that radiates into his buttocks and thighs and feelings of weakness like his legs are becoming more difficult to move.  His pain is such that he has difficulty walking, even after walking 50 or 75 feet, making it to the front of his house from the back of his house.  He does not go to the grocery store, relates his wife does this for him.  He does not go out to the hardware stores or to [employer] because of difficulty walking.  His primary care physician is Travis Le.   PAST MEDICAL HISTORY:    High blood pressure.   CURRENT MEDICATIONS:    Include gabapentin 300 mg tablet at nighttime.  He is also taking medicines of amlodipine 10 mg, Toprol XL, some intermittent tramadol and Tylenol #3.  He has taken furosemide in the past, as well as potassium chloride.  Has also taken Zestoretic for anti-cholesterol medication.   ALLERGIES:  No known drug allergies.   FAMILY HISTORY:    Significant for diabetes and high blood pressure.   SOCIAL HISTORY:    He is married to Travis Le.   Does smoke, smokes about a pack of cigarettes per day for greater than 40 years.  He is self-employed.  Does drink alcohol on a daily basis.  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  Notes though that he is unable to perform work as he has in the past, with increasing difficulties with standing and ambulation.   PHYSICAL EXAMINATION:  His height is 5 feet 10 inches, weight 190 pounds.  He has to push to get up from a sitting position and standing and ambulating he has a slight off balance.  He has the ability to toe walk, although he is slightly off balance.  Heel walking is off balance.  Forward bending fingertips to about mid calf level.  Extension with stiffness and increased pain.  Reflexes at the knee are 2+ and symmetric, the ankle 2+ and symmetric.  Dangling straight leg raise negative both sides.  Popliteal compression sign is negative both sides.  He has been having more problems with right hip and right leg pain over the lateral right leg and right thigh and calf.  It is again worsened with prolonged standing and prolonged ambulation, improves with sitting and bending, stooping.  He has tried to alter his activity level.  He has been to physical therapy and is continuing to try and remain comfortable.  He has taken Percocet which seems to relieve his pain, but it is extremely addictive and he is at high risk of having to take this on a chronic basis.  Likely this may increase his chances of having injury due to accident, either injury to himself or to others.  His lower extremity foot dorsiflexion and strength appears to be minimally weak on the right at 5-/5 with foot dorsiflexion.  Plantar flexion and strength is intact.  Left foot dorsiflexion and plantar flexion normal.  Knee extension and flexion and strength normal both sides.  Hip abduction, adduction and hip flexion testing and strength is intact.  There is range of motion of his hips.     Past Medical History   Diagnosis Date  . Hypertension   . GERD (gastroesophageal reflux disease)   . Arthritis     Past Surgical History  Procedure Laterality Date  . Broken nose  1970    No family history on file. Social History:  reports that he has been smoking.  He does not have any smokeless tobacco history on file. He reports that he drinks about 3.0 oz of alcohol per week. He reports that he does not use illicit drugs.  Allergies:  Allergies  Allergen Reactions  . Amoxicillin Rash and Other (See Comments)    Mucous Membranes, In mouth Likely some mucous membrane sloughing    Medications Prior to Admission  Medication Sig Dispense Refill  . amLODipine (NORVASC) 10 MG tablet Take 10 mg by mouth daily before breakfast.     . doxazosin (CARDURA) 2 MG tablet Take 2 mg by mouth daily before breakfast.   2  . ferrous sulfate 325 (65 FE) MG tablet Take 325 mg by mouth daily with breakfast.    . furosemide (LASIX) 20 MG tablet Take 20 mg by mouth daily.    . metoprolol succinate (TOPROL-XL) 100 MG 24 hr tablet Take 100 mg by mouth daily before breakfast. Take with or immediately following a meal.    . omeprazole (PRILOSEC) 10 MG capsule Take 20 mg by mouth daily before breakfast.       No results found for this or any previous visit (from the past 48 hour(s)). No results found.  Review of Systems  Constitutional: Negative.   HENT: Negative.   Respiratory: Negative.   Cardiovascular: Negative.   Gastrointestinal: Negative.   Genitourinary: Negative.   Musculoskeletal: Positive for back pain.  Skin: Negative.   Psychiatric/Behavioral: Negative.     Blood pressure 142/83, pulse 70, temperature 97.4 F (36.3 C), temperature source Oral, resp. rate 16, height  (1.778 m), SpO2 98 %. Physical Exam  Constitutional: He is oriented to person, place, and time. No distress.  HENT:  Head: Atraumatic.  Eyes: EOM are normal.  Neck: Normal range of motion.  Cardiovascular: Normal rate.    Respiratory: No respiratory distress.  GI: He exhibits no distension.  Musculoskeletal: He exhibits tenderness.  Neurological: He is alert and oriented to person, place, and time.  Skin: Skin is warm and dry.  Psychiatric: He has a normal mood and affect.    RADIOGRAPHS:    August 17, 2014 AP radiograph shows bilateral spurs extending off the disk spaces both the L3-4 and L4-5 levels.  Very minimal curve to the left side with apex left at the L3-4 level.  Minimal lateral listhesis at L4 and L5, maybe 2 or 3 mm.  Degree of curvature less than 10 degrees when measured from the L1 to the  L5 level.  He does have some mild degenerative changes involving the right hip joint greater than left.  Joint line narrowing superolaterally.  There is areas of subchondral sclerosis associated with the right hip acetabulum.  Lateral radiograph of the lumbar spine shows severe degenerative disk narrowing associated with the L5-S1 level; S1-S2 appears to be with rudimentary disk.  L4-5 showed degenerative disk narrowing, as does L3-4.  Some mild anterior spurring evident at L4-5.  Calcification of the abdominal aorta noted anterior to the L1 through L4 levels.  With flexion and extension no gross abnormal motion noted across the lumbar spine at any of the noted segments.    MRI scan is reviewed.  The MRI scan from 11/19/2014 demonstrates 3 level disease L3-4, L4-5, L5-S1 with degenerative disk disease and spondylosis changes resulting in multifactorial spinal stenosis changes, mild to moderate right lateral recess stenosis at the 3-4 level, moderate to severe right foraminal stenosis at L3 due to eccentric disk.  L4-5 multifactorial stenosis, right greater than left lateral recess stenosis.  Right greater than left L4 foraminal entrapment and stenosis as well.  At L5-S1 left eccentric disk osteophyte complex with moderate facet hypertrophy and ligamentum hypertrophy, severe left lateral recess stenosis with moderate to  severe spinal stenosis present at this level, moderate left and mild right foraminal stenosis.  At S1-S2 there is nearly full size disk space, left facet and uncovertebral hypertrophy, left far lateral end plate spur, mild left S1 narrowing of the neuroforamen.   ASSESSMENT:  This patient is experiencing pain to his right lower extremity.  He has had MRI that demonstrated subarticular lateral recess stenosis worse on the right side at L3-4 which would be consistent with his radicular symptoms in the right lower extremity.  He also has findings of spinal stenosis changes at L4-5 and at L5-S1, all of which may contribute to neurogenic claudication with standing and ambulation.   PLAN:  I discussed the findings with Mr. Cregg and I think that the spinal stenosis findings are the source of his discomfort and disk protrusion in the face of stenosis in the right corner at L3-4, causing an L4 radiculopathy, but also neurogenic claudication symptoms associated with  multi-level subarticular lateral recess narrowing and foraminal narrowing at L3-4, L4-5 and L5-S1.  I think that at this point he has had multiple epidural steroid injections that only seemed to relieve the pain temporarily and then the pain recurs due to the persistence of stenosis here.  I recommend that we go ahead and proceed with a 3 level decompression in order to relieve spinal stenosis in the subarticular areas and the neuroforamen L3-4, L4-5 and L5-S1.  As his curve is 6 degrees, I do not think that the fusion is necessary at this point, although if he has recurrent stenosis and we are unable to decompress out far enough laterally a fusion would be necessary or indicated.  As his study is old, we will need to repeat an MRI scan, unless he has had a more recent study than 11/19/2014.  Continue with gabapentin.  Avoid prolonged standing and ambulation, alternate sitting, standing and walking.  Flexion exercise program of lumbar spine.  We will go ahead  and schedule him for further MRI study and see him back in followup if this is changing, otherwise I think we will go ahead and schedule him to undergo decompressive surgeries at L3-4, L4-5 and L5-S1.  Review of [facility] from [facility] and from [facility] indicate that he has not  had recent MR, so he will need this before surgery is done.  Risks of surgery though were explained to the patient, including the risks of infection, bleeding, neurologic compromise. Naida Sleight, PA-C 12/25/2015, 6:57 AM

## 2015-12-26 ENCOUNTER — Encounter (HOSPITAL_COMMUNITY): Payer: Self-pay | Admitting: Specialist

## 2015-12-26 DIAGNOSIS — M4806 Spinal stenosis, lumbar region: Secondary | ICD-10-CM | POA: Diagnosis not present

## 2015-12-26 DIAGNOSIS — M48062 Spinal stenosis, lumbar region with neurogenic claudication: Secondary | ICD-10-CM | POA: Diagnosis present

## 2015-12-26 LAB — BASIC METABOLIC PANEL
Anion gap: 8 (ref 5–15)
BUN: 6 mg/dL (ref 6–20)
CHLORIDE: 104 mmol/L (ref 101–111)
CO2: 24 mmol/L (ref 22–32)
CREATININE: 0.68 mg/dL (ref 0.61–1.24)
Calcium: 8.9 mg/dL (ref 8.9–10.3)
GFR calc Af Amer: >60 mL/min (ref >60)
GFR calc non Af Amer: >60 mL/min (ref >60)
GLUCOSE: 106 mg/dL — AB (ref 65–99)
POTASSIUM: 3.8 mmol/L (ref 3.5–5.1)
Sodium: 136 mmol/L (ref 135–145)

## 2015-12-26 LAB — CBC
HEMATOCRIT: 35 % — AB (ref 39.0–52.0)
HEMOGLOBIN: 10.4 g/dL — AB (ref 13.0–17.0)
MCH: 22.3 pg — AB (ref 26.0–34.0)
MCHC: 29.7 g/dL — AB (ref 30.0–36.0)
MCV: 74.9 fL — AB (ref 78.0–100.0)
Platelets: 203 10*3/uL (ref 150–400)
RBC: 4.67 MIL/uL (ref 4.22–5.81)
RDW: 28.5 % — ABNORMAL HIGH (ref 11.5–15.5)
WBC: 12.1 10*3/uL — ABNORMAL HIGH (ref 4.0–10.5)

## 2015-12-26 MED ORDER — HYDROCODONE-ACETAMINOPHEN 5-325 MG PO TABS: 1.0000 | ORAL_TABLET | ORAL | Status: AC | PRN

## 2015-12-26 MED ORDER — METHOCARBAMOL 500 MG PO TABS: 500.0000 mg | ORAL_TABLET | Freq: Three times a day (TID) | ORAL | Status: AC | PRN

## 2015-12-26 MED ORDER — DOCUSATE SODIUM 100 MG PO CAPS: 100.0000 mg | ORAL_CAPSULE | Freq: Two times a day (BID) | ORAL | Status: AC

## 2015-12-26 MED ORDER — FERROUS SULFATE 325 (65 FE) MG PO TABS: 325.0000 mg | ORAL_TABLET | Freq: Every day | ORAL | Status: AC

## 2015-12-26 MED FILL — Thrombin For Soln 20000 Unit: CUTANEOUS | Qty: 1 | Status: AC

## 2015-12-26 NOTE — Care Management Note (Signed)
Case Management Note  Patient Details  Name: Ocean Schildt MRN: [id number] Date of Birth: [birth year]  Subjective/Objective:                    Action/Plan: Patient being discharged home with [facility] services. CM met with the patient and his family and provided them a list of [place] agencies in the [facility] area. They selected [facility] services. CM spoke with [facility] and faxed them the information they requested. Pt also ordered a rolling walker and 3 in 1. Pt refused 3 in 1 but Jermaine with [facility] notified of the walker and he will deliver the DME to the room. Will updated bedside RN.   Expected Discharge Date:                  Expected Discharge Plan:  [place]  In-House Referral:     Discharge planning Services  CM Consult  Post Acute Care Choice:  Durable Medical Equipment, Home Health Choice offered to:  Patient, Spouse  DME Arranged:  Walker rolling DME Agency:  [place] Arranged:  PT [place]:  [place]  Status of Service:  Completed, signed off  Medicare Important Message Given:    Date Medicare IM Given:    Medicare IM give by:    Date Additional Medicare IM Given:    Additional Medicare Important Message give by:     If discussed at [place] of Stay Meetings, dates discussed:    Additional Comments:  Pollie Friar, RN 12/26/2015, 10:56 AM

## 2015-12-26 NOTE — Progress Notes (Signed)
Discharge orders received. Patient, family and RN reviewed discharge instructions, patient and family vocalized understanding of follow up appointments, medications, pain medication, pain control, back precautions, signs/symptoms of infection, when to notify MD. Prescriptions, discharge instructions given to patient's daughter per patient's request. Patient currently waiting for front wheel walker to be delivered to room for home use, refused 3 in 1 bedside commode. IV removed, dressing intact. Patient states pain is "3/10," states this is manageable for him and comfortable to go home. Patient to call when equipment has arrived.

## 2015-12-26 NOTE — Progress Notes (Signed)
Patient's equipment placed in room (front wheel walker), volunteer services to escort patient by wheelchair to car for transport home

## 2015-12-26 NOTE — Evaluation (Signed)
Physical Therapy Evaluation Patient Details Name: Travis Le MRN: [id number] DOB: [birth year] Today's Date: 12/26/2015   History of Present Illness  This patient is a 70 year old male who has been experiencing pain in his lower back for years and also has had intermittent radiation into his right lower extremity. Pt s/p Lumbar laminectomy/decompression microdiscetomy 2/20.  Clinical Impression  Patient is s/p above surgery resulting in the deficits listed below (see PT Problem List).Pt tolerated OOB mobility well. Pt and spouse with good understanding of back precautions.  Patient will benefit from skilled PT to increase their independence and safety with mobility (while adhering to their precautions) to allow discharge to the venue listed below.     Follow Up Recommendations No PT follow up;Supervision - Intermittent    Equipment Recommendations  Rolling walker with 5" wheels    Recommendations for Other Services       Precautions / Restrictions Precautions Precautions: Back Precaution Booklet Issued: Yes (comment) Precaution Comments: pt able to recall but difficulty following functionally Restrictions Weight Bearing Restrictions: No      Mobility  Bed Mobility Overal bed mobility:  (Pt up in chair upon OT arrival)             General bed mobility comments: pt received sitting up in chair, discussed log roll technique for in/out of bed. pt reports "that's how i did it with the lady this morning"  Transfers Overall transfer level: Needs assistance Equipment used: Rolling walker (2 wheeled) Transfers: Sit to/from Stand Sit to Stand: Supervision         General transfer comment: v/c's to push up from arm rests and minimize trunk flexion  Ambulation/Gait Ambulation/Gait assistance: Supervision Ambulation Distance (Feet): 150 Feet Assistive device: Rolling walker (2 wheeled) Gait Pattern/deviations: Step-through pattern;Decreased stride length;Decreased stance time  - right Gait velocity: minimally decreased   General Gait Details: pt with antalgic gait pattern due to stiffness but denies pain  Stairs Stairs:  (discussed "up with the good, down with the bad')          Wheelchair Mobility    Modified Rankin (Stroke Patients Only)       Balance Overall balance assessment: No apparent balance deficits (not formally assessed)                                           Pertinent Vitals/Pain Pain Assessment: 0-10 Pain Score: 2  Pain Location: Back Pain Descriptors / Indicators: Sore Pain Intervention(s): Monitored during session    Home Living Family/patient expects to be discharged to:: Private residence Living Arrangements: Spouse/significant other Available Help at Discharge: Family;Available 24 hours/day Type of Home: House Home Access: Stairs to enter Entrance Stairs-Rails:  (2 posts) Secretary/administrator of Steps: 2 Home Layout: One level Home Equipment: Walker - 2 wheels;Bedside commode (reacher)      Prior Function Level of Independence: Independent         Comments: helps raise chickens for [employer]     Hand Dominance   Dominant Hand: Right    Extremity/Trunk Assessment   Upper Extremity Assessment: Overall WFL for tasks assessed           Lower Extremity Assessment: Defer to PT evaluation RLE Deficits / Details: generalized weakness from pain    Cervical / Trunk Assessment:  (back surgery)  Communication   Communication: No difficulties  Cognition Arousal/Alertness: Awake/alert Behavior During Therapy:  WFL for tasks assessed/performed Overall Cognitive Status: Within Functional Limits for tasks assessed                      General Comments      Exercises        Assessment/Plan    PT Assessment Patient needs continued PT services  PT Diagnosis Difficulty walking;Generalized weakness   PT Problem List Decreased activity tolerance;Decreased  mobility;Decreased knowledge of use of DME  PT Treatment Interventions DME instruction;Gait training;Stair training;Functional mobility training;Therapeutic activities;Therapeutic exercise   PT Goals (Current goals can be found in the Care Plan section) Acute Rehab PT Goals Patient Stated Goal: Go home today PT Goal Formulation: With patient Time For Goal Achievement: 01/02/16 Potential to Achieve Goals: Good    Frequency Min 5X/week   Barriers to discharge        Co-evaluation               End of Session   Activity Tolerance: Patient tolerated treatment well Patient left: in chair;with call bell/phone within reach;with chair alarm set Nurse Communication: Mobility status         Time: 0981-1914 PT Time Calculation (min) (ACUTE ONLY): 29 min   Charges:   PT Evaluation $PT Eval Low Complexity: 1 Procedure PT Treatments $Gait Training: 8-22 mins   PT G CodesMarcene Brawn 12/26/2015, 10:05 AM   Lewis Shock, PT, DPT Pager #: [phone] Office #: [phone]

## 2015-12-26 NOTE — Progress Notes (Signed)
     Subjective: 1 Day Post-Op Procedure(s) (LRB): CENTRAL LUMBAR LAMINECTOMY L3-4, L4-5 AND L5-S1, MICRODISCECTOMY RIGHT L3-4 (N/A) Awake, alert and oriented x 4. Sitting up right at bedside recliner. Tolerating po nourishment and liquids. Foley discontinued and he has Voided with out difficulty.Pain in legs is markedly improved.  Patient reports pain as mild.    Objective:   VITALS:  Temp:  [97.6 F (36.4 C)-98.8 F (37.1 C)] 98.2 F (36.8 C) (02/21 0657) Pulse Rate:  [62-85] 80 (02/21 0657) Resp:  [9-18] 18 (02/21 0657) BP: (113-147)/(51-97) 144/64 mmHg (02/21 0657) SpO2:  [96 %-100 %] 96 % (02/21 0657)  Neurologically intact ABD soft Neurovascular intact Sensation intact distally Intact pulses distally Dorsiflexion/Plantar flexion intact Incision: no drainage   LABS  Recent Labs  12/26/15 0433  HGB 10.4*  WBC 12.1*  PLT 203    Recent Labs  12/26/15 0433  NA 136  K 3.8  CL 104  CO2 24  BUN 6  CREATININE 0.68  GLUCOSE 106*   No results for input(s): LABPT, INR in the last 72 hours.   Assessment/Plan: 1 Day Post-Op Procedure(s) (LRB): CENTRAL LUMBAR LAMINECTOMY L3-4, L4-5 AND L5-S1, MICRODISCECTOMY RIGHT L3-4 (N/A)  Anemia, chronic. Iron deficiency.  Advance diet Up with therapy Discharge home with home health  NITKA,JAMES E 12/26/2015, 9:25 AM

## 2015-12-26 NOTE — Evaluation (Signed)
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 Pt s/p Lumbar laminectomy/decompression microdiscetomy 2/20.   Clinical Impression   Pt was assessed followed by ADL retraining session to include pt/family education XB:MWUX precautions during LB ADL's, functional mobility/transfers and performed tub transfer at supervision level as well today. Pt benefits from vc's for adhering to back precautions during functional activities. He is currently Min guard assist for LB ADL's but use of AE was discussed/reviewed & handouts were provided. Pt will have PRN assistance from spouse at d/c is hoping to d/c home today. Will sign off acute OT at this time.    Follow Up Recommendations  No OT follow up;Supervision - Intermittent    Equipment Recommendations  3 in 1 bedside comode    Recommendations for Other Services       Precautions / Restrictions Precautions Precautions: Back Precaution Booklet Issued: Yes (comment) Precaution Comments: pt able to recall but difficulty following functionally Restrictions Weight Bearing Restrictions: No      Mobility Bed Mobility Overal bed mobility:  (Pt up in chair upon OT arrival)             General bed mobility comments: pt received sitting up in chair, discussed log roll technique for in/out of bed. pt reports "that's how i did it with the lady this morning"  Transfers Overall transfer level: Needs assistance Equipment used: Rolling walker (2 wheeled) Transfers: Sit to/from Stand Sit to Stand: Supervision         General transfer comment: v/c's to push up from arm rests and minimize trunk flexion    Balance Overall balance assessment: No apparent balance deficits (not  formally assessed)                                          ADL Overall ADL's : Needs assistance/impaired Eating/Feeding: Independent;Sitting   Grooming: Wash/dry hands;Supervision/safety;Standing   Upper Body Bathing: Set up;Sitting;Supervision/ safety   Lower Body Bathing: Min guard;Sit to/from stand;Cueing for back precautions   Upper Body Dressing : Set up;Supervision/safety;Sitting   Lower Body Dressing: Min guard;Sit to/from stand;Cueing for back precautions   Toilet Transfer: Supervision/safety;BSC;RW;Ambulation   Toileting- Clothing Manipulation and Hygiene: Sit to/from stand;Cueing for back precautions;Min guard   Tub/ Shower Transfer: Tub transfer;Supervision/safety;With caregiver independent assisting;Adhering to back precautions;Ambulation;Anterior/posterior;Rolling walker   Functional mobility during ADLs: Supervision/safety;Rolling walker;Caregiver able to provide necessary level of assistance General ADL Comments: Pt seen for assessment followed by LB ADL (handouts provided) recommendations and tub transfer (anterior/posterior) in Rehab gym today with supervision. Pt benefits from VC's for adhering to back precautions during functional activities but able to verbalize 3/3. Discussed ADL's and recommendations for AE ( LH sponge and possible toilet aide). Pt wil lhave spouse 24/7 PRN assist at d/c and repiorts no further acute OT needs at this time.     Vision Vision Assessment?: No apparent visual deficits; No change from baseline   Perception     Praxis      Pertinent Vitals/Pain Pain Assessment: 0-10 Pain Score: 2  Pain Location: Back Pain Descriptors / Indicators: Sore Pain Intervention(s): Monitored during session     Hand Dominance Right  Extremity/Trunk Assessment Upper Extremity Assessment Upper Extremity Assessment: Overall WFL for tasks assessed   Lower Extremity Assessment Lower Extremity Assessment: Defer to PT  evaluation RLE Deficits / Details: generalized weakness from pain   Cervical / Trunk Assessment Cervical / Trunk Assessment:  (back surgery)   Communication Communication Communication: No difficulties   Cognition Arousal/Alertness: Awake/alert Behavior During Therapy: WFL for tasks assessed/performed Overall Cognitive Status: Within Functional Limits for tasks assessed                     General Comments       Exercises       Shoulder Instructions      Home Living Family/patient expects to be discharged to:: Private residence Living Arrangements: Spouse/significant other Available Help at Discharge: Family;Available 24 hours/day Type of Home: House Home Access: Stairs to enter [employer] of Steps: 2 Entrance Stairs-Rails:  (2 posts) Home Layout: One level     Bathroom Shower/Tub: Chief Strategy Officer: Handicapped height     Home Equipment: Environmental consultant - 2 wheels;Bedside commode (reacher)          Prior Functioning/Environment Level of Independence: Independent        Comments: helps raise chickens for [employer]    OT Diagnosis:     OT Problem List:     OT Treatment/Interventions:      OT Goals(Current goals can be found in the care plan section) Acute Rehab OT Goals Patient Stated Goal: Go home today OT Goal Formulation: All assessment and education complete, DC therapy  OT Frequency:     Barriers to D/C:            Co-evaluation              End of Session Equipment Utilized During Treatment: Rolling walker  Activity Tolerance: Patient tolerated treatment well Patient left: in chair;with call bell/phone within reach;with chair alarm set;with family/visitor present;Other (comment) (With MD in room)   Time: 4098-1191 OT Time Calculation (min): 16 min Charges:  OT General Charges $OT Visit: 1 Procedure OT Evaluation $OT Eval Low Complexity: 1 Procedure OT Treatments $Self Care/Home Management : 8-22  mins G-Codes:    Alm Bustard, OTR/L 12/26/2015, 9:14 AM

## 2016-01-01 NOTE — Progress Notes (Signed)
Physical Therapy Note  These are the G-codes associated with  PT note from 815am.    07-Jan-2016 0830  PT G-Codes **NOT FOR INPATIENT CLASS**  Functional Assessment Tool Used clinical judgement  Functional Limitation Mobility: Walking and moving around  Mobility: Walking and Moving Around Current Status (G9562) CI  Mobility: Walking and Moving Around Goal Status (Z3086) CI   Lewis Shock, PT, DPT Pager #: [phone] Office #: [phone]

## 2016-02-15 ENCOUNTER — Ambulatory Visit
Admission: RE | Admit: 2016-02-15 | Discharge: 2016-02-15 | Disposition: A | Discharge status: Home / Self Care | Payer: Medicare Other | Source: Ambulatory Visit | Attending: Surgery | Admitting: Surgery

## 2016-02-15 ENCOUNTER — Other Ambulatory Visit: Payer: Self-pay | Admitting: Surgery

## 2016-02-15 DIAGNOSIS — R609 Edema, unspecified: Secondary | ICD-10-CM

## 2016-02-15 DIAGNOSIS — M79604 Pain in right leg: Secondary | ICD-10-CM

## 2016-04-04 DEATH — deceased

## 2017-12-14 IMAGING — CR DG LUMBAR SPINE 2-3V
2 series · 2 of 2 positions shown · non-contrast
Comparison: Lumbar vertebra numbered as per prior MRI's.

CLINICAL DATA: Lumbar surgery.

EXAM:
LUMBAR SPINE - 2-3 VIEW

[lateral (1 of 2)]
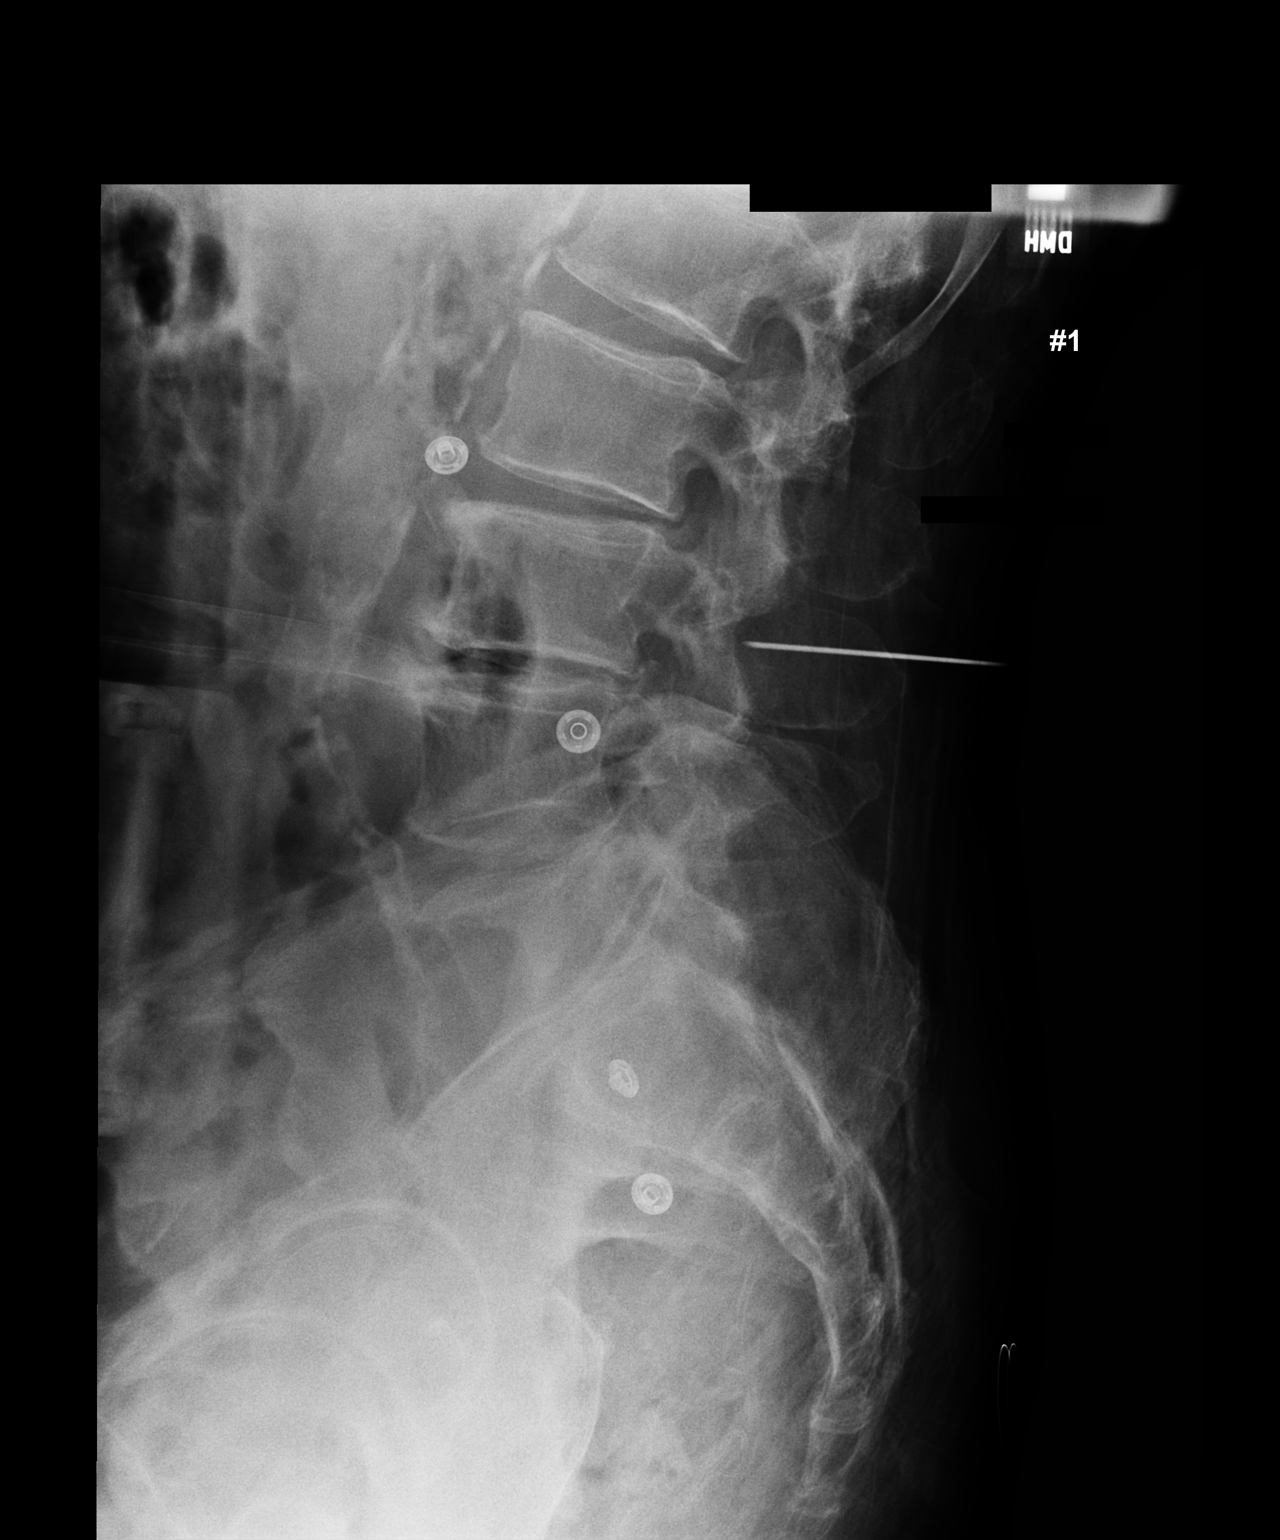

[lateral (2 of 2)]
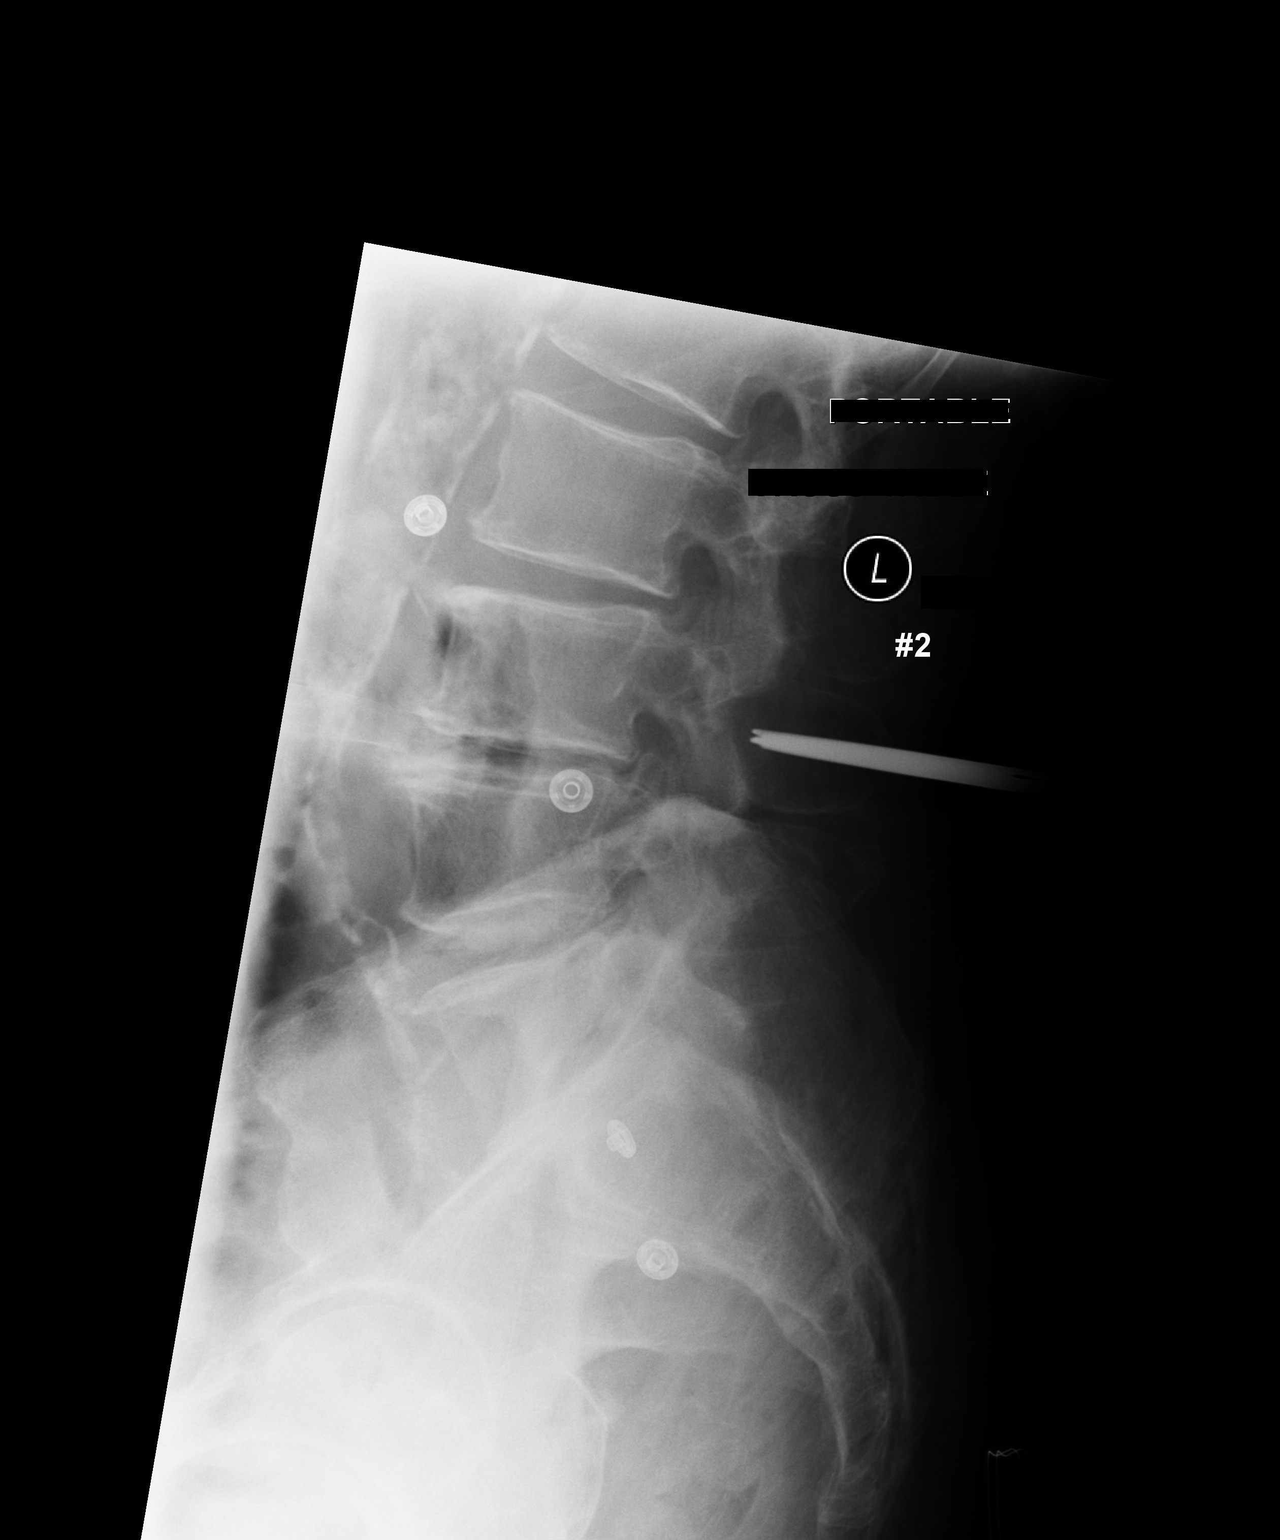

[2 of 2 positions shown; findings below may reference images not displayed]

FINDINGS: Metallic marker noted posteriorly at L4. No acute bony abnormality
identified. Degenerative changes lumbar spine. Aortoiliac
atherosclerotic vascular disease.
IMPRESSION: L4 metallic marker noted posteriorly at L4. The lumbar vertebral
bodies are numbered as per prior MRI's.

## 2018-02-04 IMAGING — US US EXTREM LOW VENOUS*R*
1 series · 13 of 24 positions shown · non-contrast
Comparison: None.

CLINICAL DATA: Right lower extremity edema. History of smoking.
Evaluate for DVT.



[Series 1: us extrem low venous*right* · 13 of 35 slices shown]
[im 1/35]
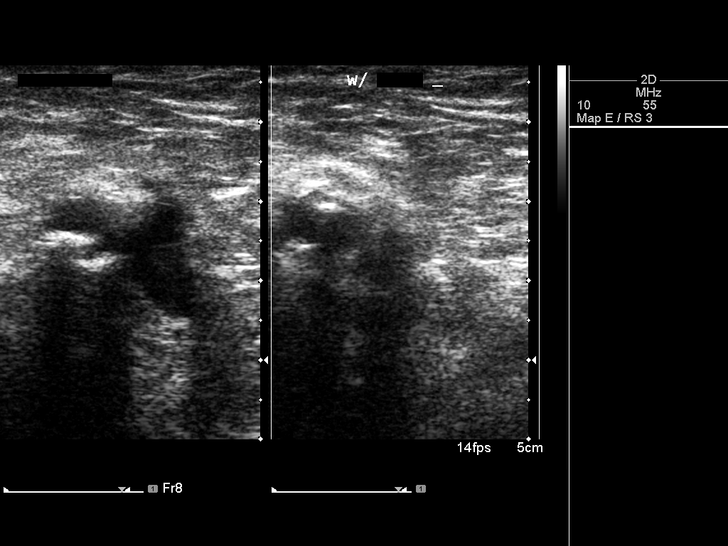
[im 3/35]
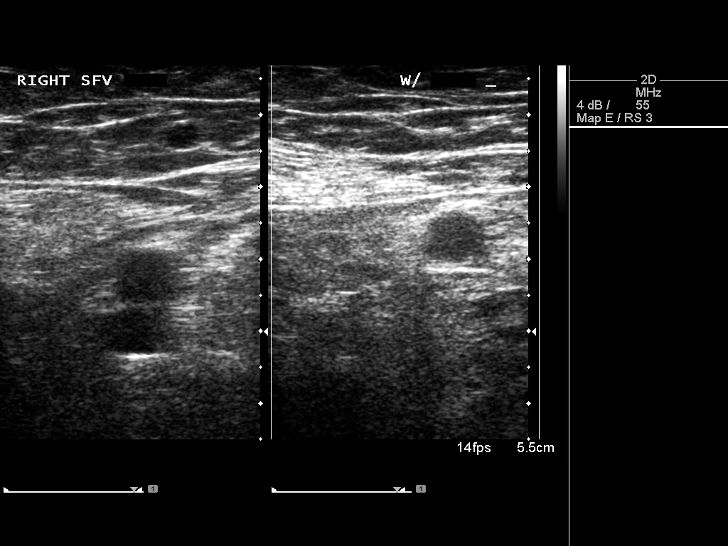
[im 6/35]
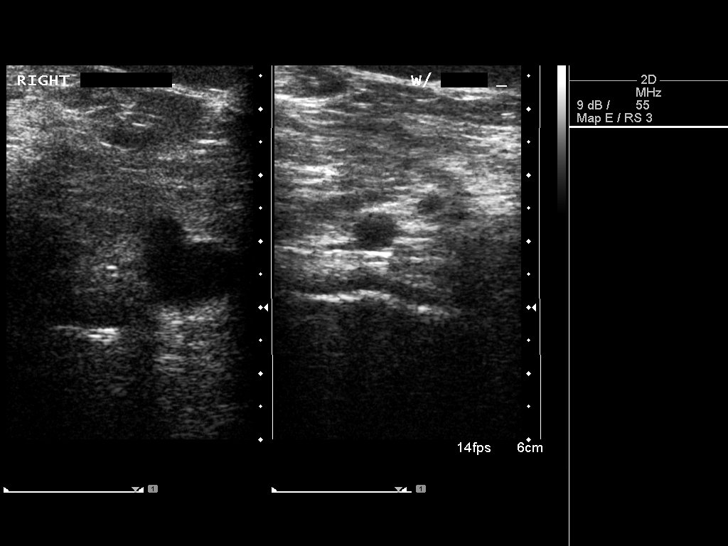
[im 9/35]
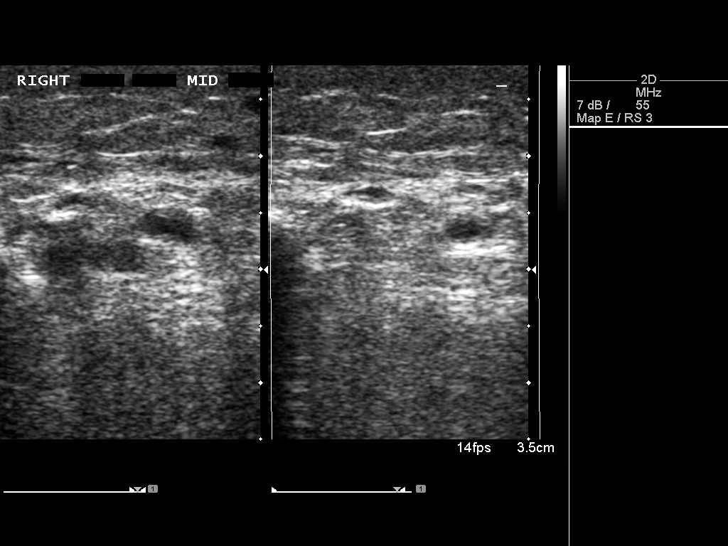
[im 12/35]
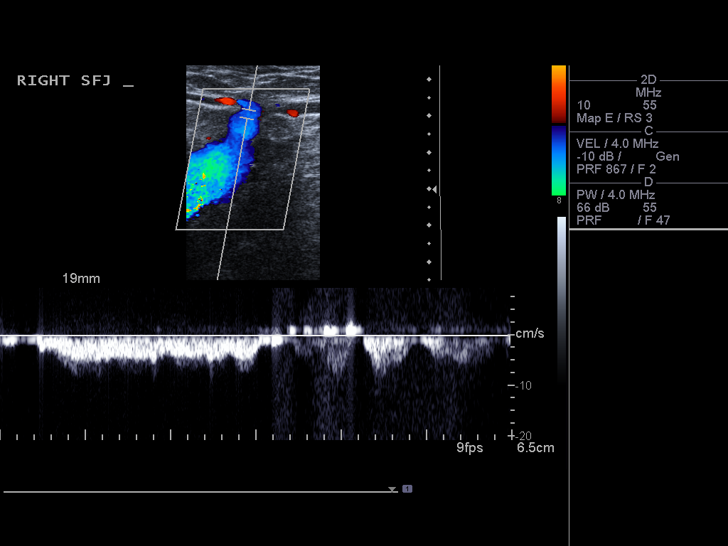
[im 15/35]
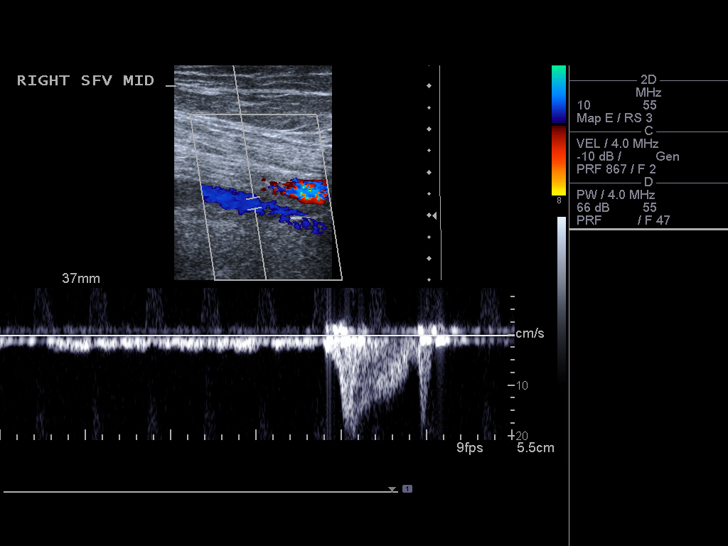
[im 18/35]
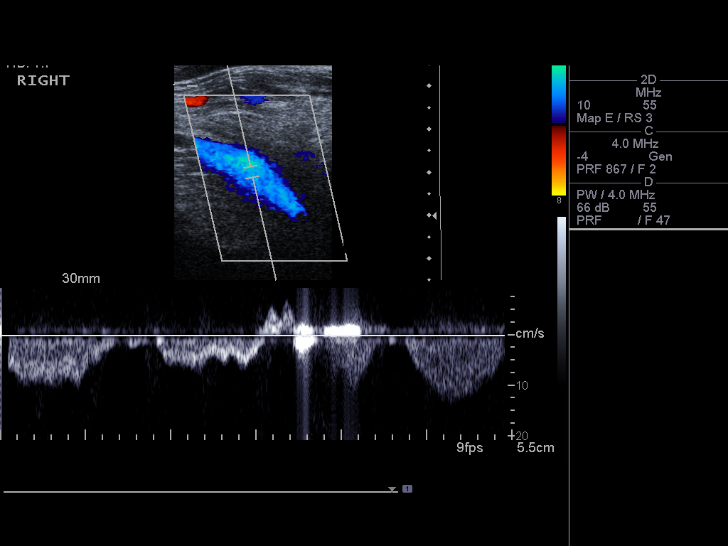
[im 20/35]
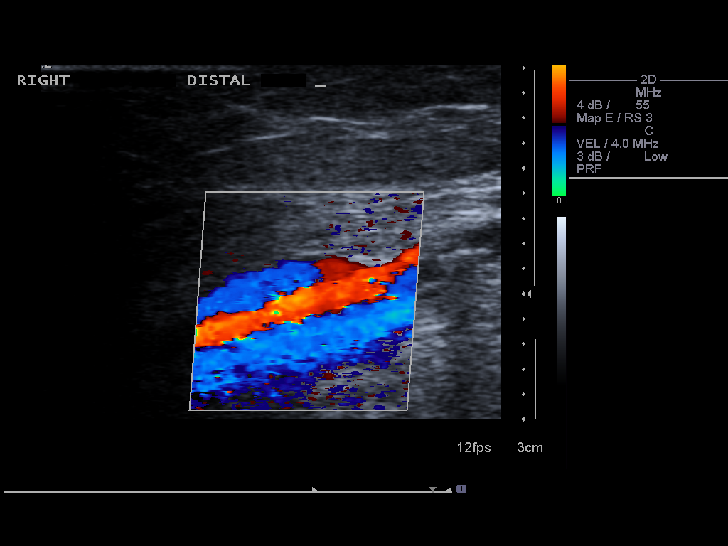
[im 23/35]
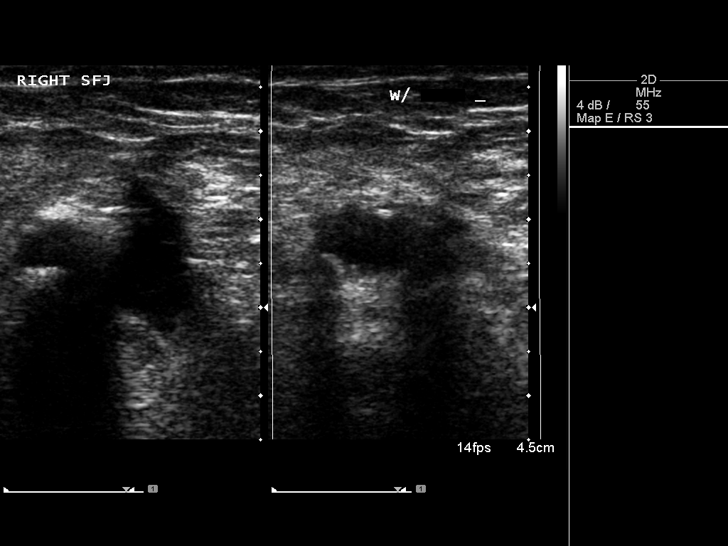
[im 26/35]
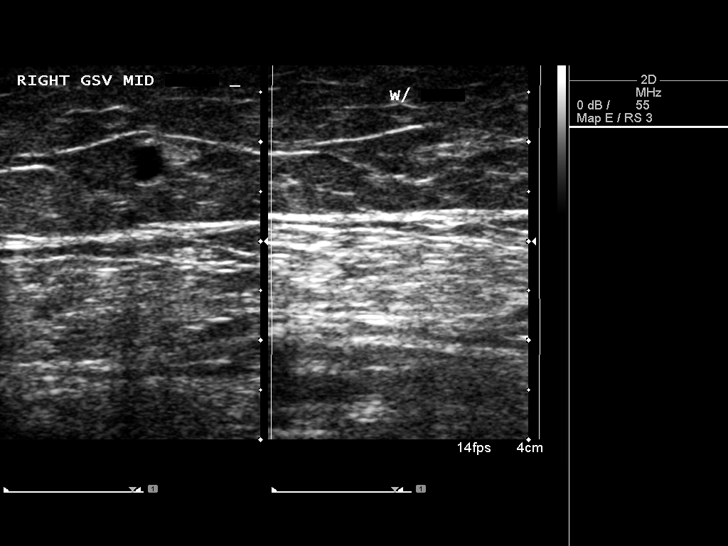
[im 29/35]
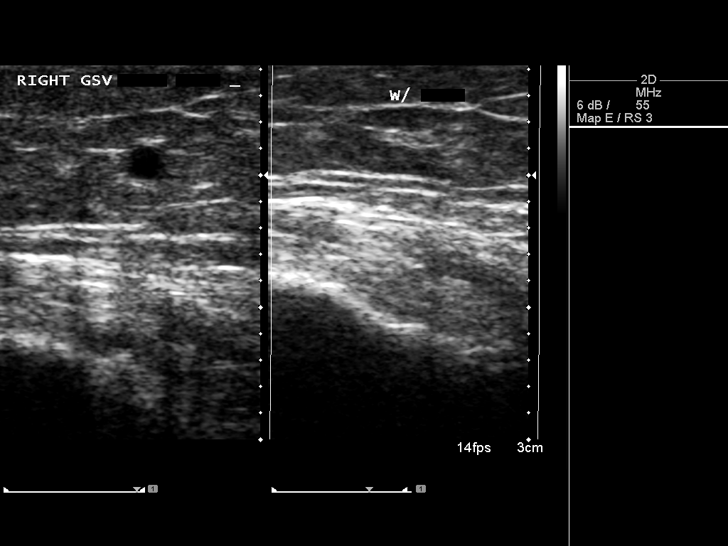
[im 32/35]
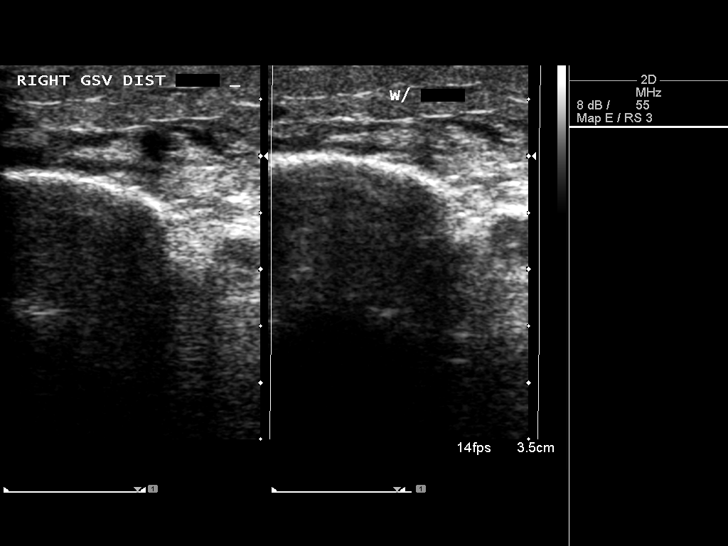
[im 35/35]
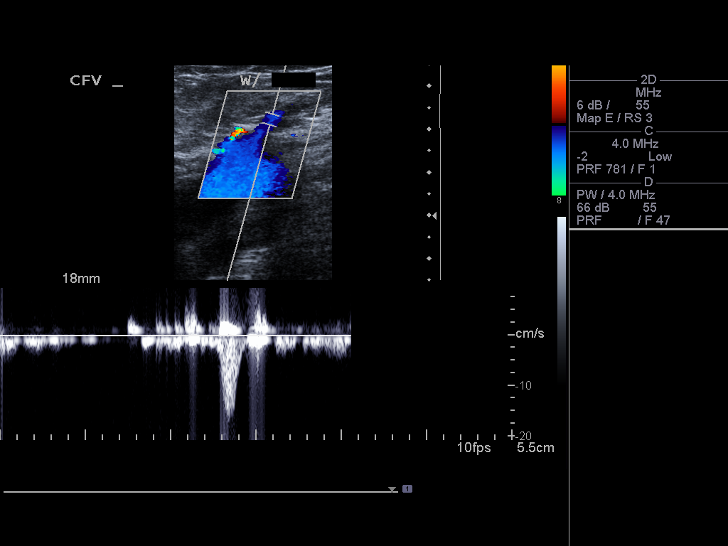

[13 of 24 positions shown; findings below may reference images not displayed]

FINDINGS: Contralateral Common Femoral Vein: Respiratory phasicity is normal
and symmetric with the symptomatic side. No evidence of thrombus.
Normal compressibility.

Common Femoral Vein: No evidence of thrombus. Normal
compressibility, respiratory phasicity and response to augmentation.

Saphenofemoral Junction: No evidence of thrombus. Normal
compressibility and flow on color Doppler imaging.

Profunda Femoral Vein: No evidence of thrombus. Normal
compressibility and flow on color Doppler imaging.

Femoral Vein: No evidence of thrombus. Normal compressibility,
respiratory phasicity and response to augmentation.

Popliteal Vein: No evidence of thrombus. Normal compressibility,
respiratory phasicity and response to augmentation.

Calf Veins: No evidence of thrombus. Normal compressibility and flow
on color Doppler imaging.

Superficial Great Saphenous Vein: No evidence of thrombus. Normal
compressibility and flow on color Doppler imaging.

Venous Reflux:  None.

Other Findings:  None.
IMPRESSION: No evidence of DVT within the right lower extremity.

## 2019-02-17 ENCOUNTER — Encounter: Payer: Self-pay | Admitting: Gastroenterology
